# Patient Record
Sex: Female | Born: 1990 | Race: White | Hispanic: No | Marital: Married | Smoking: Never smoker
Health system: Southern US, Community
[De-identification: ages and names within clinical notes are randomized; demographics above are authoritative.]

## PROBLEM LIST (undated history)

## (undated) DIAGNOSIS — N926 Irregular menstruation, unspecified: Secondary | ICD-10-CM

## (undated) DIAGNOSIS — N920 Excessive and frequent menstruation with regular cycle: Secondary | ICD-10-CM

## (undated) DIAGNOSIS — F419 Anxiety disorder, unspecified: Secondary | ICD-10-CM

## (undated) DIAGNOSIS — F32A Depression, unspecified: Secondary | ICD-10-CM

## (undated) DIAGNOSIS — R87619 Unspecified abnormal cytological findings in specimens from cervix uteri: Secondary | ICD-10-CM

## (undated) HISTORY — PX: OTHER SURGICAL HISTORY: SHX169

## (undated) HISTORY — DX: Depression, unspecified: F32.A

## (undated) HISTORY — DX: Excessive and frequent menstruation with regular cycle: N92.0

## (undated) HISTORY — DX: Unspecified abnormal cytological findings in specimens from cervix uteri: R87.619

## (undated) HISTORY — DX: Anxiety disorder, unspecified: F41.9

## (undated) HISTORY — DX: Irregular menstruation, unspecified: N92.6

---

## 2014-05-18 DIAGNOSIS — L501 Idiopathic urticaria: Secondary | ICD-10-CM | POA: Insufficient documentation

## 2014-12-10 ENCOUNTER — Encounter: Payer: Self-pay | Admitting: Obstetrics and Gynecology

## 2015-04-18 NOTE — L&D Delivery Note (Signed)
Delivery Note At  1404 a viable and healthy female "Natasha Lowery" was delivered via  (Presentation:LOA ;  ).  APGAR:8 ,9 ; weight  .   Placenta status: delivered intact with 3 vessel Cord:  with the following complications: none  Anesthesia:  epidural Episiotomy:  none Lacerations:  none Suture Repair: NA Est. Blood Loss (mL):  250  Mom to postpartum.  Baby to Couplet care / Skin to Skin.  Melody Suzan NailerN Shambley, CNM 12/15/2015, 2:18 PM  I have reviewed the record and concur with patient management and plan. Shaleena Crusoe, Daphine DeutscherMARTIN, MD, Evern CoreFACOG

## 2015-04-20 ENCOUNTER — Encounter: Payer: Self-pay | Admitting: Certified Nurse Midwife

## 2015-04-29 ENCOUNTER — Encounter: Payer: Self-pay | Admitting: Obstetrics and Gynecology

## 2015-04-29 ENCOUNTER — Ambulatory Visit (INDEPENDENT_AMBULATORY_CARE_PROVIDER_SITE_OTHER): Payer: Medicaid Other | Admitting: Obstetrics and Gynecology

## 2015-04-29 VITALS — BP 119/82 | HR 91 | Ht 66.0 in | Wt 178.8 lb

## 2015-04-29 DIAGNOSIS — O219 Vomiting of pregnancy, unspecified: Secondary | ICD-10-CM | POA: Diagnosis not present

## 2015-04-29 DIAGNOSIS — Z36 Encounter for antenatal screening of mother: Secondary | ICD-10-CM

## 2015-04-29 DIAGNOSIS — N912 Amenorrhea, unspecified: Secondary | ICD-10-CM | POA: Diagnosis not present

## 2015-04-29 DIAGNOSIS — Z3687 Encounter for antenatal screening for uncertain dates: Secondary | ICD-10-CM

## 2015-04-29 LAB — POCT URINE PREGNANCY: PREG TEST UR: POSITIVE — AB

## 2015-04-29 MED ORDER — DOXYLAMINE-PYRIDOXINE 10-10 MG PO TBEC: 10.0000 mg | DELAYED_RELEASE_TABLET | Freq: Every day | ORAL | Status: DC

## 2015-04-29 NOTE — Patient Instructions (Signed)
1.  Ultrasound for dating of her pregnancy Will be scheduled. 2.  New OB nursing intake visit will be in 2 weeks. 3.  Diclegis prescription is given for nausea. 4.  Prenatal vitamin prescription is sent in.

## 2015-04-29 NOTE — Progress Notes (Signed)
Chief complaint: 1.  Amenorrhea.  2.  Positive home pregnancy test.  The patient is a 25 year old  White female, gravida 2, para 1, 001, unsure last menstrual period,?  02/20/2015, EDD 11/27/2015, EGA 9.5 weeks, presents for pregnancy confirmation. Patient was taking multivitamins prior to conception. Patient is experiencing nausea without vomiting. Patient does report  Mild breast tenderness. Patient is not experiencing any vaginal spotting or discharge.  Past GYN history: Menarche-age 62. History of irregular cycles with interval ranging from 2 weeks to 6 weeks. Duration of flow of menses is typically 7 days. Long history of severe dysmenorrhea with central cramping and perimenstrual lock pain without significant radiation; typically requires four  Advil 3 times a day for relief; she has missed school because of severe dysmenorrhea; previous use of Mirena IUD helped significantly with cramps; no history of endometriosis, personally or in family. No history of deep thrusting dyspareunia. No history of anormal Pap smears. No history of STDs  Past OB history: Para 1, 001. SVD 1, 6 lbs. 3 oz. Female, OP delivery  Past medical history: Dysmenorrhea. Seasonal allergie.  Past surgical history: None  Family history: Colon cancer-maternal great-grandmothernd paternal great grandfather. Ovary cancer-none. Breast cancer-none.. Coronary artery disease-grandparents. Diabetes mellitus-grandparents.  Social history: Tobacco.-Negative. Alcohol-social. Drugs-negative. Patient works at [employer].  [employer].  ASSESSMENT: 1.  First trimester pregnancy, unsure last menstrual period. 2.  Nausea without vomiting in pregnancy.  PLAN: 1.  Pelvic ultrasound. 2.  Diclegis presption for nausea. 3.   Prenatal vitamin prescription. 4.  Return in 2 Weeks for New OB nursing intake and prenatal labs 5.  New OB counseling:  The patient has been given an overview regarding routine  prenatal care.  Recommendations regarding diet, weight gain, and exercise in pregnancy were given.  Prenatal testing, optional genetic testing, and ultrasound use in pregnancy were reviewed.   Benefits of Breast Feeding were discussed. The patient is encouraged to consider nursing her baby post partum. She is made aware of the NEGATIVE impact of THC (marijuana) on fetal development and postpartum development of the infant, and is strongly advised to avoid Marijuana exposure.  A total of 30 minutes were spent face-to-face with the patient during the encounter with greater than 50% dealing with counseling and coordination of care.  Herold HarmsMartin A Arial Galligan, MD

## 2015-05-04 ENCOUNTER — Ambulatory Visit (INDEPENDENT_AMBULATORY_CARE_PROVIDER_SITE_OTHER): Payer: Medicaid Other

## 2015-05-04 DIAGNOSIS — Z36 Encounter for antenatal screening of mother: Secondary | ICD-10-CM | POA: Diagnosis not present

## 2015-05-04 DIAGNOSIS — Z3687 Encounter for antenatal screening for uncertain dates: Secondary | ICD-10-CM

## 2015-05-10 ENCOUNTER — Encounter: Payer: Self-pay | Admitting: Certified Nurse Midwife

## 2015-05-13 ENCOUNTER — Ambulatory Visit (INDEPENDENT_AMBULATORY_CARE_PROVIDER_SITE_OTHER): Payer: Medicaid Other | Admitting: Obstetrics and Gynecology

## 2015-05-13 VITALS — BP 104/68 | HR 63 | Wt 180.3 lb

## 2015-05-13 DIAGNOSIS — Z1389 Encounter for screening for other disorder: Secondary | ICD-10-CM

## 2015-05-13 DIAGNOSIS — Z331 Pregnant state, incidental: Secondary | ICD-10-CM

## 2015-05-13 DIAGNOSIS — R638 Other symptoms and signs concerning food and fluid intake: Secondary | ICD-10-CM

## 2015-05-13 DIAGNOSIS — Z3490 Encounter for supervision of normal pregnancy, unspecified, unspecified trimester: Secondary | ICD-10-CM

## 2015-05-13 DIAGNOSIS — Z369 Encounter for antenatal screening, unspecified: Secondary | ICD-10-CM

## 2015-05-13 DIAGNOSIS — Z113 Encounter for screening for infections with a predominantly sexual mode of transmission: Secondary | ICD-10-CM

## 2015-05-13 DIAGNOSIS — Z36 Encounter for antenatal screening of mother: Secondary | ICD-10-CM

## 2015-05-13 MED ORDER — CONCEPT DHA 53.5-38-1 MG PO CAPS: 1.0000 | ORAL_CAPSULE | Freq: Every day | ORAL | Status: DC

## 2015-05-13 NOTE — Patient Instructions (Signed)
Hyperemesis Gravidarum Hyperemesis gravidarum is a severe form of nausea and vomiting that happens during pregnancy. Hyperemesis is worse than morning sickness. It may cause you to have nausea or vomiting all day for many days. It may keep you from eating and drinking enough food and liquids. Hyperemesis usually occurs during the first half (the first 20 weeks) of pregnancy. It often goes away once a woman is in her second half of pregnancy. However, sometimes hyperemesis continues through an entire pregnancy.  CAUSES  The cause of this condition is not completely known but is thought to be related to changes in the body's hormones when pregnant. It could be from the high level of the pregnancy hormone or an increase in estrogen in the body.  SIGNS AND SYMPTOMS   Severe nausea and vomiting.  Nausea that does not go away.  Vomiting that does not allow you to keep any food down.  Weight loss and body fluid loss (dehydration).  Having no desire to eat or not liking food you have previously enjoyed. DIAGNOSIS  Your health care provider will do a physical exam and ask you about your symptoms. He or she may also order blood tests and urine tests to make sure something else is not causing the problem.  TREATMENT  You may only need medicine to control the problem. If medicines do not control the nausea and vomiting, you will be treated in the hospital to prevent dehydration, increased acid in the blood (acidosis), weight loss, and changes in the electrolytes in your body that may harm the unborn baby (fetus). You may need IV fluids.  HOME CARE INSTRUCTIONS   Only take over-the-counter or prescription medicines as directed by your health care provider.  Try eating a couple of dry crackers or toast in the morning before getting out of bed.  Avoid foods and smells that upset your stomach.  Avoid fatty and spicy foods.  Eat 5-6 small meals a day.  Do not drink when eating meals. Drink between  meals.  For snacks, eat high-protein foods, such as cheese.  Eat or suck on things that have ginger in them. Ginger helps nausea.  Avoid food preparation. The smell of food can spoil your appetite.  Avoid iron pills and iron in your multivitamins until after 3-4 months of being pregnant. However, consult with your health care provider before stopping any prescribed iron pills. SEEK MEDICAL CARE IF:   Your abdominal pain increases.  You have a severe headache.  You have vision problems.  You are losing weight. SEEK IMMEDIATE MEDICAL CARE IF:   You are unable to keep fluids down.  You vomit blood.  You have constant nausea and vomiting.  You have excessive weakness.  You have extreme thirst.  You have dizziness or fainting.  You have a fever or persistent symptoms for more than 2-3 days.  You have a fever and your symptoms suddenly get worse. MAKE SURE YOU:   Understand these instructions.  Will watch your condition.  Will get help right away if you are not doing well or get worse.   This information is not intended to replace advice given to you by your health care provider. Make sure you discuss any questions you have with your health care provider.   Document Released: 04/03/2005 Document Revised: 01/22/2013 Document Reviewed: 11/13/2012 Elsevier Interactive Patient Education 2016 [employer]. Commonly Asked Questions During Pregnancy  Cats: A parasite can be excreted in cat feces.  To avoid exposure you need to  have another person empty the little box.  If you must empty the litter box you will need to wear gloves.  Wash your hands after handling your cat.  This parasite can also be found in raw or undercooked meat so this should also be avoided.  Colds, Sore Throats, Flu: Please check your medication sheet to see what you can take for symptoms.  If your symptoms are unrelieved by these medications please call the office.  Dental Work: Most any dental work  Agricultural consultant recommends is permitted.  X-rays should only be taken during the first trimester if absolutely necessary.  Your abdomen should be shielded with a lead apron during all x-rays.  Please notify your provider prior to receiving any x-rays.  Novocaine is fine; gas is not recommended.  If your dentist requires a note from [place] prior to dental work please call the office and we will provide one for you.  Exercise: Exercise is an important part of staying healthy during your pregnancy.  You may continue most exercises you were accustomed to prior to pregnancy.  Later in your pregnancy you will most likely notice you have difficulty with activities requiring balance like riding a bicycle.  It is important that you listen to your body and avoid activities that put you at a higher risk of falling.  Adequate rest and staying well hydrated are a must!  If you have questions about the safety of specific activities ask your provider.    Exposure to Children with illness: Try to avoid obvious exposure; report any symptoms to [place] when noted,  If you have chicken pos, red measles or mumps, you should be immune to these diseases.   Please do not take any vaccines while pregnant unless you have checked with your OB provider.  Fetal Movement: After 28 weeks we recommend you do "kick counts" twice daily.  Lie or sit down in a calm quiet environment and count your baby movements "kicks".  You should feel your baby at least 10 times per hour.  If you have not felt 10 kicks within the first hour get up, walk around and have something sweet to eat or drink then repeat for an additional hour.  If count remains less than 10 per hour notify your provider.  Fumigating: Follow your pest control agent's advice as to how long to stay out of your home.  Ventilate the area well before re-entering.  Hemorrhoids:   Most over-the-counter preparations can be used during pregnancy.  Check your medication to see what is safe to use.  It  is important to use a stool softener or fiber in your diet and to drink lots of liquids.  If hemorrhoids seem to be getting worse please call the office.   Hot Tubs:  Hot tubs Jacuzzis and saunas are not recommended while pregnant.  These increase your internal body temperature and should be avoided.  Intercourse:  Sexual intercourse is safe during pregnancy as long as you are comfortable, unless otherwise advised by your provider.  Spotting may occur after intercourse; report any bright red bleeding that is heavier than spotting.  Labor:  If you know that you are in labor, please go to the hospital.  If you are unsure, please call the office and let us help you decide what to do.  Lifting, straining, etc:  If your job requires heavy lifting or straining please check with your provider for any limitations.  Generally, you should not lift items heavier than that  you can lift simply with your hands and arms (no back muscles)  Painting:  Paint fumes do not harm your pregnancy, but may make you ill and should be avoided if possible.  Latex or water based paints have less odor than oils.  Use adequate ventilation while painting.  Permanents & Hair Color:  Chemicals in hair dyes are not recommended as they cause increase hair dryness which can increase hair loss during pregnancy.  " Highlighting" and permanents are allowed.  Dye may be absorbed differently and permanents may not hold as well during pregnancy.  Sunbathing:  Use a sunscreen, as skin burns easily during pregnancy.  Drink plenty of fluids; avoid over heating.  Tanning Beds:  Because their possible side effects are still unknown, tanning beds are not recommended.  Ultrasound Scans:  Routine ultrasounds are performed at approximately 20 weeks.  You will be able to see your baby's general anatomy an if you would like to know the gender this can usually be determined as well.  If it is questionable when you conceived you may also receive an  ultrasound early in your pregnancy for dating purposes.  Otherwise ultrasound exams are not routinely performed unless there is a medical necessity.  Although you can request a scan we ask that you pay for it when conducted because insurance does not cover " patient request" scans.  Work: If your pregnancy proceeds without complications you may work until your due date, unless your physician or employer advises otherwise.  Round Ligament Pain/Pelvic Discomfort:  Sharp, shooting pains not associated with bleeding are fairly common, usually occurring in the second trimester of pregnancy.  They tend to be worse when standing up or when you remain standing for long periods of time.  These are the result of pressure of certain pelvic ligaments called "round ligaments".  Rest, Tylenol and heat seem to be the most effective relief.  As the womb and fetus grow, they rise out of the pelvis and the discomfort improves.  Please notify the office if your pain seems different than that described.  It may represent a more serious condition.  Minor Illnesses and Medications in Pregnancy  Cold/Flu:  Sudafed for congestion- Robitussin (plain) for cough- Tylenol for discomfort.  Please follow the directions on the label.  Try not to take any more than needed.  OTC Saline nasal spray and air humidifier or cool-mist  Vaporizer to sooth nasal irritation and to loosen congestion.  It is also important to increase intake of non carbonated fluids, especially if you have a fever.  Constipation:  Colace-2 capsules at bedtime; Metamucil- follow directions on label; Senokot- 1 tablet at bedtime.  Any one of these medications can be used.  It is also very important to increase fluids and fruits along with regular exercise.  If problem persists please call the office.  Diarrhea:  Kaopectate as directed on the label.  Eat a bland diet and increase fluids.  Avoid highly seasoned foods.  Headache:  Tylenol 1 or 2 tablets every 3-4  hours as needed  Indigestion:  Maalox, Mylanta, Tums or Rolaids- as directed on label.  Also try to eat small meals and avoid fatty, greasy or spicy foods.  Nausea with or without Vomiting:  Nausea in pregnancy is caused by increased levels of hormones in the body which influence the digestive system and cause irritation when stomach acids accumulate.  Symptoms usually subside after 1st trimester of pregnancy.  Try the following:  Keep saltines, graham crackers  or dry toast by your bed to eat upon awakening.  Don't let your stomach get empty.  Try to eat 5-6 small meals per day instead of 3 large ones.  Avoid greasy fatty or highly seasoned foods.   Take OTC Unisom 1 tablet at bed time along with OTC Vitamin B6 25-50 mg 3 times per day.    If nausea continues with vomiting and you are unable to keep down food and fluids you may need a prescription medication.  Please notify your provider.   Sore throat:  Chloraseptic spray, throat lozenges and or plain Tylenol.  Vaginal Yeast Infection:  OTC Monistat for 7 days as directed on label.  If symptoms do not resolve within a week notify provider.  If any of the above problems do not subside with recommended treatment please call the office for further assistance.   Do not take Aspirin, Advil, Motrin or Ibuprofen.  * * OTC= Over the counter Pregnancy and Zika Virus Disease Zika virus disease, or Zika, is an illness that can spread to people from mosquitoes that carry the virus. It may also spread from person to person through infected body fluids. Zika first occurred in [place], but recently it has spread to new areas. The virus occurs in tropical climates. The location of Zika continues to change. Most people who become infected with Zika virus do not develop serious illness. However, Zika may cause birth defects in an unborn baby whose mother is infected with the virus. It may also increase the risk of miscarriage. WHAT ARE THE SYMPTOMS OF ZIKA  VIRUS DISEASE? In many cases, people who have been infected with Zika virus do not develop any symptoms. If symptoms appear, they usually start about a week after the person is infected. Symptoms are usually mild. They may include:  Fever.  Rash.  Red eyes.  Joint pain. HOW DOES ZIKA VIRUS DISEASE SPREAD? The main way that Zika virus spreads is through the bite of a certain type of mosquito. Unlike most types of mosquitos, which bite only at night, the type of mosquito that carries Zika virus bites both at night and during the day. Zika virus can also spread through sexual contact, through a blood transfusion, and from a mother to her baby before or during birth. Once you have had Zika virus disease, it is unlikely that you will get it again. CAN I PASS ZIKA TO MY BABY DURING PREGNANCY? Yes, Zika can pass from a mother to her baby before or during birth. WHAT PROBLEMS CAN ZIKA CAUSE FOR MY BABY? A woman who is infected with Zika virus while pregnant is at risk of having her baby born with a condition in which the brain or head is smaller than expected (microcephaly). Babies who have microcephaly can have developmental delays, seizures, hearing problems, and vision problems. Having Zika virus disease during pregnancy can also increase the risk of miscarriage. HOW CAN ZIKA VIRUS DISEASE BE PREVENTED? There is no vaccine to prevent Zika. The best way to prevent the disease is to avoid infected mosquitoes and avoid exposure to body fluids that can spread the virus. Avoid any possible exposure to Zika by taking the following precautions. For women and their sex partners:  Avoid traveling to high-risk areas. The locations where [place] is being reported change often. To identify high-risk areas, check the CDC travel website: http://davidson-gomez.com/  If you or your sex partner must travel to a high-risk area, talk with a health care provider before and after traveling.  Take all precautions  to avoid mosquito bites if you live in, or travel to, any of the high-risk areas. Insect repellents are safe to use during pregnancy.  Ask your health care provider when it is safe to have sexual contact. For women:  If you are pregnant or trying to become pregnant, avoid sexual contact with persons who may have been exposed to [place] virus, persons who have possible symptoms of Zika, or persons whose history you are unsure about. If you choose to have sexual contact with someone who may have been exposed to [place] virus, use condoms correctly during the entire duration of sexual activity, every time. Do not share sexual devices, as you may be exposed to body fluids.  Ask your health care provider about when it is safe to attempt pregnancy after a possible exposure to Zika virus. WHAT STEPS SHOULD I TAKE TO AVOID MOSQUITO BITES? Take these steps to avoid mosquito bites when you are in a high-risk area:  Wear loose clothing that covers your arms and legs.  Limit your outdoor activities.  Do not open windows unless they have window screens.  Sleep under mosquito nets.  Use insect repellent. The best insect repellents have:  DEET, picaridin, oil of lemon eucalyptus (OLE), or IR3535 in them.  Higher amounts of an active ingredient in them.  Remember that insect repellents are safe to use during pregnancy.  Do not use OLE on children who are younger than 66 years of age. Do not use insect repellent on babies who are younger than 59 months of age.  Cover your child's stroller with mosquito netting. Make sure the netting fits snugly and that any loose netting does not cover your child's mouth or nose. Do not use a blanket as a mosquito-protection cover.  Do not apply insect repellent underneath clothing.  If you are using sunscreen, apply the sunscreen before applying the insect repellent.  Treat clothing with permethrin. Do not apply permethrin directly to your skin. Follow label directions  for safe use.  Get rid of standing water, where mosquitoes may reproduce. Standing water is often found in items such as buckets, bowls, animal food dishes, and flowerpots. When you return from traveling to any high-risk area, continue taking actions to protect yourself against mosquito bites for 3 weeks, even if you show no signs of illness. This will prevent spreading Zika virus to uninfected mosquitoes. WHAT SHOULD I KNOW ABOUT THE SEXUAL TRANSMISSION OF ZIKA? People can spread Zika to their sexual partners during vaginal, anal, or oral sex, or by sharing sexual devices. Many people with [place] do not develop symptoms, so a person could spread the disease without knowing that they are infected. The greatest risk is to women who are pregnant or who may become pregnant. Zika virus can live longer in semen than it can live in blood. Couples can prevent sexual transmission of the virus by:  Using condoms correctly during the entire duration of sexual activity, every time. This includes vaginal, anal, and oral sex.  Not sharing sexual devices. Sharing increases your risk of being exposed to body fluid from another person.  Avoiding all sexual activity until your health care provider says it is safe. SHOULD I BE TESTED FOR ZIKA VIRUS? A sample of your blood can be tested for Zika virus. A pregnant woman should be tested if she may have been exposed to the virus or if she has symptoms of Zika. She may also have additional tests done during her pregnancy, such  ultrasound testing. Talk with your health care provider about which tests are recommended.   This information is not intended to replace advice given to you by your health care provider. Make sure you discuss any questions you have with your health care provider.   Document Released: 12/23/2014 Document Reviewed: 12/16/2014 Elsevier Interactive Patient Education [employer]

## 2015-05-13 NOTE — Progress Notes (Signed)
   Natasha Lowery presents for NOB nurse interview visit. G-2.  P-1001. Pregnancy education material explained and given. No cats in the home. NOB labs ordered. TSH/HbgA1c due to Increased BMI, HIV labs and Drug screen were explained optional and she could opt out of tests but did not decline. Drug screen ordered. PNV encouraged. NT ordered, will try to schedule with NOB physical if possible. Pt states her mother has Lupus but is in remission and MGM is deceased due to Lupus.  Also wants msAFP in 2nd trimester. Pt. To follow up with provider in 3 weeks for NOB physical.  All questions answered.  ZIKA EXPOSURE SCREEN:  The patient has not traveled to a [place] Virus endemic area within the past 6 months, nor has she had unprotected sex with a partner who has travelled to a [place] endemic region within the past 6 months. The patient has been advised to notify us if these factors change any time during this current pregnancy, so adequate testing and monitoring can be initiated.

## 2015-05-14 ENCOUNTER — Other Ambulatory Visit: Payer: Self-pay | Admitting: Obstetrics and Gynecology

## 2015-05-14 DIAGNOSIS — Z2839 Other underimmunization status: Secondary | ICD-10-CM

## 2015-05-14 DIAGNOSIS — Z6791 Unspecified blood type, Rh negative: Secondary | ICD-10-CM | POA: Insufficient documentation

## 2015-05-14 DIAGNOSIS — O26899 Other specified pregnancy related conditions, unspecified trimester: Secondary | ICD-10-CM

## 2015-05-14 DIAGNOSIS — O9989 Other specified diseases and conditions complicating pregnancy, childbirth and the puerperium: Secondary | ICD-10-CM

## 2015-05-14 DIAGNOSIS — O360110 Maternal care for anti-D [Rh] antibodies, first trimester, not applicable or unspecified: Secondary | ICD-10-CM

## 2015-05-14 DIAGNOSIS — Z283 Underimmunization status: Secondary | ICD-10-CM | POA: Insufficient documentation

## 2015-05-14 DIAGNOSIS — O09899 Supervision of other high risk pregnancies, unspecified trimester: Secondary | ICD-10-CM

## 2015-05-14 LAB — RPR: RPR: NONREACTIVE

## 2015-05-14 LAB — CBC WITH DIFFERENTIAL/PLATELET
BASOS ABS: 0 10*3/uL (ref 0.0–0.2)
Basos: 0 %
EOS (ABSOLUTE): 0.1 10*3/uL (ref 0.0–0.4)
Eos: 1 %
HEMOGLOBIN: 11.8 g/dL (ref 11.1–15.9)
Hematocrit: 34.5 % (ref 34.0–46.6)
IMMATURE GRANS (ABS): 0 10*3/uL (ref 0.0–0.1)
IMMATURE GRANULOCYTES: 0 %
LYMPHS: 29 %
Lymphocytes Absolute: 2 10*3/uL (ref 0.7–3.1)
MCH: 29.6 pg (ref 26.6–33.0)
MCHC: 34.2 g/dL (ref 31.5–35.7)
MCV: 87 fL (ref 79–97)
MONOCYTES: 10 %
Monocytes Absolute: 0.7 10*3/uL (ref 0.1–0.9)
NEUTROS ABS: 4.1 10*3/uL (ref 1.4–7.0)
Neutrophils: 60 %
Platelets: 235 10*3/uL (ref 150–379)
RBC: 3.99 x10E6/uL (ref 3.77–5.28)
RDW: 12.9 % (ref 12.3–15.4)
WBC: 6.9 10*3/uL (ref 3.4–10.8)

## 2015-05-14 LAB — RH TYPE: Rh Factor: NEGATIVE

## 2015-05-14 LAB — URINALYSIS, ROUTINE W REFLEX MICROSCOPIC
Bilirubin, UA: NEGATIVE
Glucose, UA: NEGATIVE
KETONES UA: NEGATIVE
Nitrite, UA: NEGATIVE
PH UA: 6.5 (ref 5.0–7.5)
Protein, UA: NEGATIVE
RBC, UA: NEGATIVE
SPEC GRAV UA: 1.01 (ref 1.005–1.030)
Urobilinogen, Ur: 0.2 mg/dL (ref 0.2–1.0)

## 2015-05-14 LAB — MICROSCOPIC EXAMINATION: Casts: NONE SEEN /lpf

## 2015-05-14 LAB — RUBELLA ANTIBODY, IGM: Rubella IgM: <20 AU/mL (ref 0.0–19.9)

## 2015-05-14 LAB — TSH: TSH: 1.59 u[IU]/mL (ref 0.450–4.500)

## 2015-05-14 LAB — NICOTINE SCREEN, URINE: COTININE UR QL SCN: NEGATIVE ng/mL

## 2015-05-14 LAB — HEMOGLOBIN A1C
Est. average glucose Bld gHb Est-mCnc: 108 mg/dL
HEMOGLOBIN A1C: 5.4 % (ref 4.8–5.6)

## 2015-05-14 LAB — ANTIBODY SCREEN: ANTIBODY SCREEN: NEGATIVE

## 2015-05-14 LAB — VARICELLA ZOSTER ANTIBODY, IGM: Varicella IgM: <0.91 index (ref 0.00–0.90)

## 2015-05-14 LAB — ABO

## 2015-05-14 LAB — HIV ANTIBODY (ROUTINE TESTING W REFLEX): HIV SCREEN 4TH GENERATION: NONREACTIVE

## 2015-05-14 LAB — HEPATITIS B SURFACE ANTIGEN: Hepatitis B Surface Ag: NEGATIVE

## 2015-05-15 LAB — GC/CHLAMYDIA PROBE AMP
Chlamydia trachomatis, NAA: NEGATIVE
Neisseria gonorrhoeae by PCR: NEGATIVE

## 2015-05-15 LAB — CULTURE, OB URINE

## 2015-05-15 LAB — URINE CULTURE, OB REFLEX

## 2015-05-19 LAB — PAIN MGT SCRN (14 DRUGS), UR
AMPHETAMINE SCRN UR: NEGATIVE ng/mL
BENZODIAZEPINE SCREEN, URINE: NEGATIVE ng/mL
Barbiturate Screen, Ur: NEGATIVE ng/mL
Buprenorphine, Urine: NEGATIVE ng/mL
CANNABINOIDS UR QL SCN: NEGATIVE ng/mL
COCAINE(METAB.) SCREEN, URINE: NEGATIVE ng/mL
Creatinine(Crt), U: 51.7 mg/dL (ref 20.0–300.0)
FENTANYL, URINE: NEGATIVE pg/mL
Meperidine Screen, Urine: NEGATIVE ng/mL
Methadone Scn, Ur: NEGATIVE ng/mL
OXYCODONE+OXYMORPHONE UR QL SCN: NEGATIVE ng/mL
Opiate Scrn, Ur: NEGATIVE ng/mL
PCP SCRN UR: NEGATIVE ng/mL
PH UR, DRUG SCRN: 6.4 (ref 4.5–8.9)
PROPOXYPHENE SCREEN: NEGATIVE ng/mL
TRAMADOL UR QL SCN: NEGATIVE ng/mL

## 2015-06-10 ENCOUNTER — Ambulatory Visit (INDEPENDENT_AMBULATORY_CARE_PROVIDER_SITE_OTHER): Payer: Medicaid Other

## 2015-06-10 ENCOUNTER — Encounter: Payer: Self-pay | Admitting: Obstetrics and Gynecology

## 2015-06-10 ENCOUNTER — Other Ambulatory Visit: Payer: Self-pay | Admitting: Obstetrics and Gynecology

## 2015-06-10 ENCOUNTER — Ambulatory Visit (INDEPENDENT_AMBULATORY_CARE_PROVIDER_SITE_OTHER): Payer: Medicaid Other | Admitting: Obstetrics and Gynecology

## 2015-06-10 VITALS — BP 109/64 | HR 72 | Wt 178.9 lb

## 2015-06-10 DIAGNOSIS — O09899 Supervision of other high risk pregnancies, unspecified trimester: Secondary | ICD-10-CM

## 2015-06-10 DIAGNOSIS — Z283 Underimmunization status: Secondary | ICD-10-CM

## 2015-06-10 DIAGNOSIS — Z331 Pregnant state, incidental: Secondary | ICD-10-CM

## 2015-06-10 DIAGNOSIS — O9989 Other specified diseases and conditions complicating pregnancy, childbirth and the puerperium: Secondary | ICD-10-CM

## 2015-06-10 DIAGNOSIS — Z3490 Encounter for supervision of normal pregnancy, unspecified, unspecified trimester: Secondary | ICD-10-CM

## 2015-06-10 DIAGNOSIS — Z36 Encounter for antenatal screening of mother: Secondary | ICD-10-CM | POA: Diagnosis not present

## 2015-06-10 DIAGNOSIS — Z369 Encounter for antenatal screening, unspecified: Secondary | ICD-10-CM

## 2015-06-10 DIAGNOSIS — Z2839 Other underimmunization status: Secondary | ICD-10-CM

## 2015-06-10 DIAGNOSIS — Z789 Other specified health status: Secondary | ICD-10-CM

## 2015-06-10 LAB — POCT URINALYSIS DIPSTICK
Bilirubin, UA: NEGATIVE
GLUCOSE UA: NEGATIVE
Ketones, UA: NEGATIVE
Leukocytes, UA: NEGATIVE
NITRITE UA: NEGATIVE
PROTEIN UA: NEGATIVE
RBC UA: NEGATIVE
UROBILINOGEN UA: 0.2
pH, UA: 6

## 2015-06-10 NOTE — Progress Notes (Signed)
NEW OB HISTORY AND PHYSICAL  SUBJECTIVE:       Natasha Lowery is a 25 y.o. G62P1001 female, Patient's last menstrual period was 02/20/2015 (approximate)., Estimated Date of Delivery: 12/18/15, [redacted]w[redacted]d, presents today for establishment of Prenatal Care. She has no unusual complaints and complains of decreased appetite,      Gynecologic History Patient's last menstrual period was 02/20/2015 (approximate). Normal Contraception: none Last Pap: 2014. Results were: normal  Obstetric History OB History  Gravida Para Term Preterm AB SAB TAB Ectopic Multiple Living  # Outcome Date GA Lbr Len/2nd Weight Sex Delivery Anes PTL Lv  2 Current           1 Term 2014   6 lb 4.8 oz (2.858 kg) M Vag-Spont   Y      Past Medical History  Diagnosis Date  . Heavy periods   . Irregular periods     Past Surgical History  Procedure Laterality Date  . None      Current Outpatient Prescriptions on File Prior to Visit  Medication Sig Dispense Refill  . Doxylamine-Pyridoxine 10-10 MG TBEC Take 10 mg by mouth daily. 60 tablet 1  . Prenat-FeFum-FePo-FA-Omega 3 (CONCEPT DHA) 53.5-38-1 MG CAPS Take 1 tablet by mouth daily. 30 capsule 11   No current facility-administered medications on file prior to visit.    No Known Allergies  Social History   Social History  . Marital Status: Married    Spouse Name: N/A  . Number of Children: N/A  . Years of Education: N/A   Occupational History  . Not on file.   Social History Main Topics  . Smoking status: Never Smoker   . Smokeless tobacco: Not on file  . Alcohol Use: Yes     Comment: rare  . Drug Use: No  . Sexual Activity: Yes    Birth Control/ Protection: None   Other Topics Concern  . Not on file   Social History Narrative    Family History  Problem Relation Age of Onset  . Colon cancer Maternal Grandmother     grt mat gm  . Diabetes Maternal Grandfather   . Diabetes Paternal Grandmother   . Heart disease Paternal  Grandmother   . Breast cancer Neg Hx   . Ovarian cancer Neg Hx   . Lupus Mother     in remission  . Lupus Maternal Grandmother     deceased due to Lupus    The following portions of the patient's history were reviewed and updated as appropriate: allergies, current medications, past OB history, past medical history, past surgical history, past family history, past social history, and problem list.    OBJECTIVE: Initial Physical Exam (New OB)  GENERAL APPEARANCE: alert, well appearing, in no apparent distress, oriented to person, place and time, overweight HEAD: normocephalic, atraumatic MOUTH: mucous membranes moist, pharynx normal without lesions and dental hygiene good THYROID: no thyromegaly or masses present BREASTS: not examined LUNGS: clear to auscultation, no wheezes, rales or rhonchi, symmetric air entry HEART: regular rate and rhythm, no murmurs ABDOMEN: soft, nontender, nondistended, no abnormal masses, no epigastric pain, fundus not palpable and FHT present EXTREMITIES: no redness or tenderness in the calves or thighs SKIN: normal coloration and turgor, no rashes LYMPH NODES: no adenopathy palpable NEUROLOGIC: alert, oriented, normal speech, no focal findings or movement disorder noted  Indications:First Trimester Screen - NT Findings:  Singleton intrauterine pregnancy is visualized with  a CRL consistent with 13 1/[redacted] weeks gestation, giving an (U/S) EDD of 12/15/15. The (U/S) EDD is consistent with the clinically established (LMP) EDD of 12/18/15.  FHR: 147 CRL measurement: 68.6 mm NT measurement: 1.7 mm. Questionable SUA.  Reevaluate at [facility].  Right Ovary measures 3.6 x 2.5 x 2.5  cm. It is normal in appearance. Left Ovary measures 3.9 x 2.5 x 2.6 cm. It is normal appearance. There is evidence of a corpus luteal cyst in the Left Survey of the adnexa demonstrates no adnexal masses. There is no free peritoneal fluid in the cul de sac.  Impression: 1. 13 1/7  week Viable Singleton Intrauterine pregnancy by U/S. 2. (U/S) EDD is consistent with Clinically established (LMP) EDD of 12/18/15. 3. NT Screen successfully completed.  Recommendations: 1.Clinical correlation with the patient's History and Physical Exam. 2. Evaluate for SUA at anatomy scan PELVIC EXAM EXTERNAL GENITALIA: normal appearing vulva with no masses, tenderness or lesions CERVIX: no lesions or cervical motion tenderness UTERUS: gravid  ASSESSMENT: Normal pregnancy Overweight   PLAN: Prenatal care Pap obtained See orders

## 2015-06-10 NOTE — Progress Notes (Signed)
ROB- pt is having some heartburn

## 2015-06-10 NOTE — Patient Instructions (Addendum)
Second Trimester of Pregnancy The second trimester is from week 13 through week 28, months 4 through 6. The second trimester is often a time when you feel your best. Your body has also adjusted to being pregnant, and you begin to feel better physically. Usually, morning sickness has lessened or quit completely, you may have more energy, and you may have an increase in appetite. The second trimester is also a time when the fetus is growing rapidly. At the end of the sixth month, the fetus is about 9 inches long and weighs about 1 pounds. You will likely begin to feel the baby move (quickening) between 18 and 20 weeks of the pregnancy. BODY CHANGES Your body goes through many changes during pregnancy. The changes vary from woman to woman.   Your weight will continue to increase. You will notice your lower abdomen bulging out.  You may begin to get stretch marks on your hips, abdomen, and breasts.  You may develop headaches that can be relieved by medicines approved by your health care provider.  You may urinate more often because the fetus is pressing on your bladder.  You may develop or continue to have heartburn as a result of your pregnancy.  You may develop constipation because certain hormones are causing the muscles that push waste through your intestines to slow down.  You may develop hemorrhoids or swollen, bulging veins (varicose veins).  You may have back pain because of the weight gain and pregnancy hormones relaxing your joints between the bones in your pelvis and as a result of a shift in weight and the muscles that support your balance.  Your breasts will continue to grow and be tender.  Your gums may bleed and may be sensitive to brushing and flossing.  Dark spots or blotches (chloasma, mask of pregnancy) may develop on your face. This will likely fade after the baby is born.  A dark line from your belly button to the pubic area (linea nigra) may appear. This will likely fade  after the baby is born.  You may have changes in your hair. These can include thickening of your hair, rapid growth, and changes in texture. Some women also have hair loss during or after pregnancy, or hair that feels dry or thin. Your hair will most likely return to normal after your baby is born. WHAT TO EXPECT AT YOUR PRENATAL VISITS During a routine prenatal visit:  You will be weighed to make sure you and the fetus are growing normally.  Your blood pressure will be taken.  Your abdomen will be measured to track your baby's growth.  The fetal heartbeat will be listened to.  Any test results from the previous visit will be discussed. Your health care provider may ask you:  How you are feeling.  If you are feeling the baby move.  If you have had any abnormal symptoms, such as leaking fluid, bleeding, severe headaches, or abdominal cramping.  If you are using any tobacco products, including cigarettes, chewing tobacco, and electronic cigarettes.  If you have any questions. Other tests that may be performed during your second trimester include:  Blood tests that check for:  Low iron levels (anemia).  Gestational diabetes (between 24 and 28 weeks).  Rh antibodies.  Urine tests to check for infections, diabetes, or protein in the urine.  An ultrasound to confirm the proper growth and development of the baby.  An amniocentesis to check for possible genetic problems.  Fetal screens for spina bifida   and Down syndrome.  HIV (human immunodeficiency virus) testing. Routine prenatal testing includes screening for HIV, unless you choose not to have this test. HOME CARE INSTRUCTIONS   Avoid all smoking, herbs, alcohol, and unprescribed drugs. These chemicals affect the formation and growth of the baby.  Do not use any tobacco products, including cigarettes, chewing tobacco, and electronic cigarettes. If you need help quitting, ask your health care provider. You may receive  counseling support and other resources to help you quit.  Follow your health care provider's instructions regarding medicine use. There are medicines that are either safe or unsafe to take during pregnancy.  Exercise only as directed by your health care provider. Experiencing uterine cramps is a good sign to stop exercising.  Continue to eat regular, healthy meals.  Wear a good support bra for breast tenderness.  Do not use hot tubs, steam rooms, or saunas.  Wear your seat belt at all times when driving.  Avoid raw meat, uncooked cheese, cat litter boxes, and soil used by cats. These carry germs that can cause birth defects in the baby.  Take your prenatal vitamins.  Take 1500-2000 mg of calcium daily starting at the 20th week of pregnancy until you deliver your baby.  Try taking a stool softener (if your health care provider approves) if you develop constipation. Eat more high-fiber foods, such as fresh vegetables or fruit and whole grains. Drink plenty of fluids to keep your urine clear or pale yellow.  Take warm sitz baths to soothe any pain or discomfort caused by hemorrhoids. Use hemorrhoid cream if your health care provider approves.  If you develop varicose veins, wear support hose. Elevate your feet for 15 minutes, 3-4 times a day. Limit salt in your diet.  Avoid heavy lifting, wear low heel shoes, and practice good posture.  Rest with your legs elevated if you have leg cramps or low back pain.  Visit your dentist if you have not gone yet during your pregnancy. Use a soft toothbrush to brush your teeth and be gentle when you floss.  A sexual relationship may be continued unless your health care provider directs you otherwise.  Continue to go to all your prenatal visits as directed by your health care provider. SEEK MEDICAL CARE IF:   You have dizziness.  You have mild pelvic cramps, pelvic pressure, or nagging pain in the abdominal area.  You have persistent nausea,  vomiting, or diarrhea.  You have a bad smelling vaginal discharge.  You have pain with urination. SEEK IMMEDIATE MEDICAL CARE IF:   You have a fever.  You are leaking fluid from your vagina.  You have spotting or bleeding from your vagina.  You have severe abdominal cramping or pain.  You have rapid weight gain or loss.  You have shortness of breath with chest pain.  You notice sudden or extreme swelling of your face, hands, ankles, feet, or legs.  You have not felt your baby move in over an hour.  You have severe headaches that do not go away with medicine.  You have vision changes.   This information is not intended to replace advice given to you by your health care provider. Make sure you discuss any questions you have with your health care provider.   Document Released: 03/28/2001 Document Revised: 04/24/2014 Document Reviewed: 06/04/2012 Elsevier Interactive Patient Education [employer]   Thank you for enrolling in [facility]. Please follow the instructions below to securely access your online medical record. MyChart allows  you to send messages to your doctor, view your test results, renew your prescriptions, schedule appointments, and more.  How Do I Sign Up? 1. In your Internet browser, go to [email or web address] 2. Click on the New  User? link in the Sign In box.  3. Enter your MyChart Access Code exactly as it appears below. You will not need to use this code after you have completed the sign-up process. If you do not sign up before the expiration date, you must request a new code. MyChart Access Code: [email or web address] Expires: 06/13/2015  8:28 AM  4. Enter the last four digits of your Social Security Number (xxxx) and Date of Birth (mm/dd/yyyy) as indicated and click Next. You will be taken to the next sign-up page. 5. Create a MyChart ID. This will be your MyChart login ID and cannot be changed, so think of one that is secure and easy to  remember. 6. Create a MyChart password. You can change your password at any time. 7. Enter your Password Reset Question and Answer and click Next. This can be used at a later time if you forget your password.  8. Select your communication preference, and if applicable enter your e-mail address. You will receive e-mail notification when new information is available in MyChart by choosing to receive e-mail notifications and filling in your e-mail. 9. Click Sign In. You can now view your medical record.   Additional Information If you have questions, you can email [email or web address] or call [phone] to talk to our MyChart staff. Remember, MyChart is NOT to be used for urgent needs. For medical emergencies, dial 911.

## 2015-06-11 LAB — CYTOLOGY - PAP

## 2015-06-12 LAB — FIRST TRIMESTER SCREEN W/NT
CRL: 68.6 mm
DIA MoM: 0.95
DIA VALUE: 194.2 pg/mL
GEST AGE-COLLECT: 12.9 wk
HCG VALUE: 50.1 [IU]/mL
Maternal Age At EDD: 25.7 years
NUCHAL TRANSLUCENCY MOM: 1
Nuchal Translucency: 1.7 mm
Number of Fetuses: 1
PAPP-A MOM: 0.85
PAPP-A Value: 782.2 ng/mL
PDF: 0
TEST RESULTS: NEGATIVE
Weight: 178 [lb_av]
hCG MoM: 0.64

## 2015-06-15 ENCOUNTER — Telehealth: Payer: Self-pay | Admitting: *Deleted

## 2015-06-15 NOTE — Telephone Encounter (Signed)
-----   Message from Purcell Nails, [place] sent at 06/15/2015  2:30 PM EST ----- Please let her know NT was negative for increased risk

## 2015-06-15 NOTE — Telephone Encounter (Signed)
Notified pt of results 

## 2015-07-08 ENCOUNTER — Encounter: Payer: Self-pay | Admitting: Obstetrics and Gynecology

## 2015-07-08 ENCOUNTER — Ambulatory Visit (INDEPENDENT_AMBULATORY_CARE_PROVIDER_SITE_OTHER): Payer: Medicaid Other | Admitting: Obstetrics and Gynecology

## 2015-07-08 VITALS — BP 118/76 | HR 89 | Wt 185.3 lb

## 2015-07-08 DIAGNOSIS — Z331 Pregnant state, incidental: Secondary | ICD-10-CM

## 2015-07-08 LAB — POCT URINALYSIS DIPSTICK
Bilirubin, UA: NEGATIVE
Blood, UA: NEGATIVE
Glucose, UA: NEGATIVE
KETONES UA: NEGATIVE
LEUKOCYTES UA: NEGATIVE
Nitrite, UA: NEGATIVE
PROTEIN UA: NEGATIVE
Spec Grav, UA: <=1.005
Urobilinogen, UA: 0.2
pH, UA: 6

## 2015-07-08 NOTE — Progress Notes (Signed)
ROB- doing well except sore throat since yesterday, no fever- throat clear on exam and no lymphadenopathy. Anatomy scan next visit.

## 2015-07-08 NOTE — Progress Notes (Signed)
ROB- pt denies any complaints, she is having slight sore throat

## 2015-07-22 ENCOUNTER — Other Ambulatory Visit: Payer: Medicaid Other

## 2015-07-29 ENCOUNTER — Ambulatory Visit (INDEPENDENT_AMBULATORY_CARE_PROVIDER_SITE_OTHER): Payer: Medicaid Other

## 2015-07-29 DIAGNOSIS — Z331 Pregnant state, incidental: Secondary | ICD-10-CM | POA: Diagnosis not present

## 2015-08-04 ENCOUNTER — Other Ambulatory Visit: Payer: Self-pay | Admitting: Obstetrics and Gynecology

## 2015-08-04 DIAGNOSIS — Z0489 Encounter for examination and observation for other specified reasons: Secondary | ICD-10-CM

## 2015-08-04 DIAGNOSIS — IMO0002 Reserved for concepts with insufficient information to code with codable children: Secondary | ICD-10-CM

## 2015-08-06 ENCOUNTER — Ambulatory Visit (INDEPENDENT_AMBULATORY_CARE_PROVIDER_SITE_OTHER): Payer: Medicaid Other | Admitting: Obstetrics and Gynecology

## 2015-08-06 ENCOUNTER — Ambulatory Visit (INDEPENDENT_AMBULATORY_CARE_PROVIDER_SITE_OTHER): Payer: Medicaid Other

## 2015-08-06 ENCOUNTER — Encounter: Payer: Self-pay | Admitting: Obstetrics and Gynecology

## 2015-08-06 VITALS — BP 119/71 | HR 83 | Wt 186.7 lb

## 2015-08-06 DIAGNOSIS — IMO0002 Reserved for concepts with insufficient information to code with codable children: Secondary | ICD-10-CM

## 2015-08-06 DIAGNOSIS — Z0489 Encounter for examination and observation for other specified reasons: Secondary | ICD-10-CM

## 2015-08-06 DIAGNOSIS — Z331 Pregnant state, incidental: Secondary | ICD-10-CM

## 2015-08-06 DIAGNOSIS — Z36 Encounter for antenatal screening of mother: Secondary | ICD-10-CM

## 2015-08-06 LAB — POCT URINALYSIS DIPSTICK
Bilirubin, UA: NEGATIVE
Blood, UA: NEGATIVE
GLUCOSE UA: NEGATIVE
KETONES UA: NEGATIVE
Leukocytes, UA: NEGATIVE
Nitrite, UA: NEGATIVE
Spec Grav, UA: 1.015
Urobilinogen, UA: 0.2
pH, UA: 6

## 2015-08-06 NOTE — Progress Notes (Signed)
ROB- pt is c/o B feet swelling

## 2015-08-06 NOTE — Progress Notes (Signed)
ROB- doing well, LLP resolved on todays scan and anatomy complete,

## 2015-09-03 ENCOUNTER — Encounter: Payer: Self-pay | Admitting: Obstetrics and Gynecology

## 2015-09-03 ENCOUNTER — Ambulatory Visit (INDEPENDENT_AMBULATORY_CARE_PROVIDER_SITE_OTHER): Payer: Medicaid Other | Admitting: Obstetrics and Gynecology

## 2015-09-03 VITALS — BP 112/77 | HR 83 | Wt 191.0 lb

## 2015-09-03 DIAGNOSIS — Z3492 Encounter for supervision of normal pregnancy, unspecified, second trimester: Secondary | ICD-10-CM

## 2015-09-03 DIAGNOSIS — E049 Nontoxic goiter, unspecified: Secondary | ICD-10-CM

## 2015-09-03 LAB — POCT URINALYSIS DIPSTICK
Bilirubin, UA: NEGATIVE
Blood, UA: NEGATIVE
GLUCOSE UA: NEGATIVE
Ketones, UA: NEGATIVE
Leukocytes, UA: NEGATIVE
Nitrite, UA: NEGATIVE
PROTEIN UA: NEGATIVE
Spec Grav, UA: 1.01
UROBILINOGEN UA: 0.2
pH, UA: 7

## 2015-09-03 NOTE — Progress Notes (Signed)
ROB- thyroid enlarged on exam- u/s ordered, and will do TSH on next lab draw with glucola.

## 2015-09-03 NOTE — Progress Notes (Signed)
ROB- pt is doing well States the last several weeks she can feel her pulse in her throat

## 2015-09-07 ENCOUNTER — Ambulatory Visit
Admission: RE | Admit: 2015-09-07 | Discharge: 2015-09-07 | Disposition: A | Discharge status: Home / Self Care | Payer: Medicaid Other | Source: Ambulatory Visit | Attending: Obstetrics and Gynecology | Admitting: Obstetrics and Gynecology

## 2015-09-07 DIAGNOSIS — Z3492 Encounter for supervision of normal pregnancy, unspecified, second trimester: Secondary | ICD-10-CM | POA: Insufficient documentation

## 2015-09-07 DIAGNOSIS — E049 Nontoxic goiter, unspecified: Secondary | ICD-10-CM

## 2015-09-07 DIAGNOSIS — Z3A24 24 weeks gestation of pregnancy: Secondary | ICD-10-CM | POA: Diagnosis not present

## 2015-09-07 DIAGNOSIS — E079 Disorder of thyroid, unspecified: Secondary | ICD-10-CM | POA: Diagnosis not present

## 2015-09-14 ENCOUNTER — Telehealth: Payer: Self-pay | Admitting: *Deleted

## 2015-09-14 NOTE — Telephone Encounter (Signed)
Notified pt of results 

## 2015-09-14 NOTE — Telephone Encounter (Signed)
-----   Message from [place] Lakes[place]ody N [place], [place] sent at 09/10/2015  5:35 PM EDT ----- Please let her know thyroid ultrasound showed some enlargement but no nodules, we will follow it as pregnancy continues and then after delivery, but no worries now.

## 2015-09-24 ENCOUNTER — Encounter: Payer: Self-pay | Admitting: Obstetrics and Gynecology

## 2015-09-24 ENCOUNTER — Ambulatory Visit (INDEPENDENT_AMBULATORY_CARE_PROVIDER_SITE_OTHER): Payer: Medicaid Other | Admitting: Obstetrics and Gynecology

## 2015-09-24 ENCOUNTER — Other Ambulatory Visit: Payer: Medicaid Other

## 2015-09-24 VITALS — BP 111/84 | HR 87 | Wt 190.1 lb

## 2015-09-24 DIAGNOSIS — Z130 Encounter for screening for diseases of the blood and blood-forming organs and certain disorders involving the immune mechanism: Secondary | ICD-10-CM

## 2015-09-24 DIAGNOSIS — Z3492 Encounter for supervision of normal pregnancy, unspecified, second trimester: Secondary | ICD-10-CM | POA: Diagnosis not present

## 2015-09-24 DIAGNOSIS — Z131 Encounter for screening for diabetes mellitus: Secondary | ICD-10-CM

## 2015-09-24 DIAGNOSIS — Z23 Encounter for immunization: Secondary | ICD-10-CM | POA: Diagnosis not present

## 2015-09-24 DIAGNOSIS — E049 Nontoxic goiter, unspecified: Secondary | ICD-10-CM

## 2015-09-24 LAB — POCT URINALYSIS DIPSTICK
Bilirubin, UA: NEGATIVE
Blood, UA: NEGATIVE
GLUCOSE UA: NEGATIVE
Ketones, UA: NEGATIVE
NITRITE UA: NEGATIVE
PROTEIN UA: NEGATIVE
Spec Grav, UA: <=1.005
UROBILINOGEN UA: 0.2
pH, UA: 7.5

## 2015-09-24 MED ORDER — TETANUS-DIPHTH-ACELL PERTUSSIS 5-2.5-18.5 LF-MCG/0.5 IM SUSP
0.5000 mL | Freq: Once | INTRAMUSCULAR | Status: AC
  Administered 2015-09-24: 0.5 mL via INTRAMUSCULAR

## 2015-09-24 MED ORDER — RHO D IMMUNE GLOBULIN 1500 UNITS IM SOSY
1500.0000 [IU] | PREFILLED_SYRINGE | Freq: Once | INTRAMUSCULAR | Status: AC
  Administered 2015-09-24: 1500 [IU] via INTRAMUSCULAR

## 2015-09-24 NOTE — Progress Notes (Signed)
ROB-doing well, no concerns. 

## 2015-09-24 NOTE — Progress Notes (Signed)
ROB- GTT, btc, tdap, Rhogam and tsh.

## 2015-09-25 LAB — TSH: TSH: 1.04 u[IU]/mL (ref 0.450–4.500)

## 2015-09-25 LAB — GLUCOSE, 1 HOUR GESTATIONAL: GESTATIONAL DIABETES SCREEN: 134 mg/dL (ref 65–139)

## 2015-09-25 LAB — HEMOGLOBIN AND HEMATOCRIT, BLOOD
HEMATOCRIT: 37 % (ref 34.0–46.6)
HEMOGLOBIN: 12.2 g/dL (ref 11.1–15.9)

## 2015-10-13 ENCOUNTER — Encounter: Payer: Self-pay | Admitting: Obstetrics and Gynecology

## 2015-10-13 ENCOUNTER — Ambulatory Visit (INDEPENDENT_AMBULATORY_CARE_PROVIDER_SITE_OTHER): Payer: Medicaid Other | Admitting: Obstetrics and Gynecology

## 2015-10-13 VITALS — BP 121/82 | HR 87 | Wt 195.6 lb

## 2015-10-13 DIAGNOSIS — Z3493 Encounter for supervision of normal pregnancy, unspecified, third trimester: Secondary | ICD-10-CM

## 2015-10-13 DIAGNOSIS — L299 Pruritus, unspecified: Secondary | ICD-10-CM

## 2015-10-13 LAB — POCT URINALYSIS DIPSTICK
BILIRUBIN UA: NEGATIVE
Glucose, UA: NEGATIVE
KETONES UA: NEGATIVE
LEUKOCYTES UA: NEGATIVE
Nitrite, UA: NEGATIVE
PH UA: 6.5
Protein, UA: NEGATIVE
RBC UA: NEGATIVE
Spec Grav, UA: 1.01
Urobilinogen, UA: 0.2

## 2015-10-13 NOTE — Progress Notes (Signed)
ROB- pt states she is itching very badly, she has changed her shampoo, body wash, steadily gotten worse, worse in the evenings States its a burning sensation

## 2015-10-13 NOTE — Progress Notes (Signed)
ROB- will draw labs to rule out causes of itching- OK to restart antihystamine. Also will need note to start maternity leave Aug.2 as she will be leaving work to take car of other child as Mother in law (watches her child) is having foot surgery

## 2015-10-14 ENCOUNTER — Telehealth: Payer: Self-pay | Admitting: *Deleted

## 2015-10-14 LAB — BILE ACIDS, TOTAL: BILE ACIDS TOTAL: 4.6 umol/L — AB (ref 4.7–24.5)

## 2015-10-14 NOTE — Telephone Encounter (Signed)
-----   Message from Purcell NailsMelody N Shambley, [place] sent at 10/14/2015  3:21 PM EDT ----- Please let her know levels are not elevated, no worries on liver function

## 2015-10-14 NOTE — Telephone Encounter (Signed)
Notified pt of results 

## 2015-11-04 ENCOUNTER — Ambulatory Visit (INDEPENDENT_AMBULATORY_CARE_PROVIDER_SITE_OTHER): Payer: Medicaid Other | Admitting: Obstetrics and Gynecology

## 2015-11-04 ENCOUNTER — Encounter: Payer: Self-pay | Admitting: Obstetrics and Gynecology

## 2015-11-04 VITALS — BP 117/84 | HR 82 | Wt 199.8 lb

## 2015-11-04 DIAGNOSIS — Z3493 Encounter for supervision of normal pregnancy, unspecified, third trimester: Secondary | ICD-10-CM

## 2015-11-04 LAB — POCT URINALYSIS DIPSTICK
BILIRUBIN UA: NEGATIVE
Glucose, UA: NEGATIVE
Ketones, UA: NEGATIVE
Leukocytes, UA: NEGATIVE
NITRITE UA: NEGATIVE
PH UA: 6.5
Protein, UA: NEGATIVE
RBC UA: NEGATIVE
SPEC GRAV UA: 1.015
UROBILINOGEN UA: 0.2

## 2015-11-04 NOTE — Progress Notes (Signed)
ROB- pt is doing well 

## 2015-11-04 NOTE — Progress Notes (Signed)
ROB-doing well, itching is better.discussed previous labor- SROM 5d before due date and no complication in labor.

## 2015-11-22 ENCOUNTER — Other Ambulatory Visit: Payer: Self-pay | Admitting: *Deleted

## 2015-11-22 DIAGNOSIS — Z113 Encounter for screening for infections with a predominantly sexual mode of transmission: Secondary | ICD-10-CM

## 2015-11-22 DIAGNOSIS — Z3685 Encounter for antenatal screening for Streptococcus B: Secondary | ICD-10-CM

## 2015-11-22 DIAGNOSIS — Z3493 Encounter for supervision of normal pregnancy, unspecified, third trimester: Secondary | ICD-10-CM

## 2015-11-23 ENCOUNTER — Ambulatory Visit (INDEPENDENT_AMBULATORY_CARE_PROVIDER_SITE_OTHER): Payer: Medicaid Other | Admitting: Obstetrics and Gynecology

## 2015-11-23 VITALS — BP 116/83 | HR 76 | Wt 204.8 lb

## 2015-11-23 DIAGNOSIS — Z0289 Encounter for other administrative examinations: Secondary | ICD-10-CM

## 2015-11-23 DIAGNOSIS — Z3685 Encounter for antenatal screening for Streptococcus B: Secondary | ICD-10-CM

## 2015-11-23 DIAGNOSIS — Z113 Encounter for screening for infections with a predominantly sexual mode of transmission: Secondary | ICD-10-CM

## 2015-11-23 DIAGNOSIS — Z3493 Encounter for supervision of normal pregnancy, unspecified, third trimester: Secondary | ICD-10-CM

## 2015-11-23 DIAGNOSIS — Z36 Encounter for antenatal screening of mother: Secondary | ICD-10-CM

## 2015-11-23 LAB — POCT URINALYSIS DIPSTICK
Bilirubin, UA: NEGATIVE
Blood, UA: NEGATIVE
Glucose, UA: NEGATIVE
Ketones, UA: NEGATIVE
LEUKOCYTES UA: NEGATIVE
Nitrite, UA: NEGATIVE
PH UA: 7
PROTEIN UA: NEGATIVE
Spec Grav, UA: <=1.005
UROBILINOGEN UA: 0.2

## 2015-11-23 NOTE — Progress Notes (Signed)
ROB- discussed dizziness; cultures obtained, labor discussed

## 2015-11-23 NOTE — Addendum Note (Signed)
Addended by: Lunette StandsSIEMIENSKI, Caidyn Henricksen J on: 11/23/2015 02:54 PM   Modules accepted: Orders

## 2015-11-23 NOTE — Progress Notes (Signed)
ROB- cultures obtained, pt is having a lot of low back pain, feeling slight lightheaded the last few days

## 2015-11-25 LAB — GC/CHLAMYDIA PROBE AMP
Chlamydia trachomatis, NAA: NEGATIVE
NEISSERIA GONORRHOEAE BY PCR: NEGATIVE

## 2015-11-25 LAB — STREP GP B NAA: Strep Gp B NAA: NEGATIVE

## 2015-11-30 ENCOUNTER — Ambulatory Visit (INDEPENDENT_AMBULATORY_CARE_PROVIDER_SITE_OTHER): Payer: Medicaid Other | Admitting: Obstetrics and Gynecology

## 2015-11-30 VITALS — BP 122/82 | HR 74 | Wt 203.7 lb

## 2015-11-30 DIAGNOSIS — Z3493 Encounter for supervision of normal pregnancy, unspecified, third trimester: Secondary | ICD-10-CM

## 2015-11-30 LAB — POCT URINALYSIS DIPSTICK
BILIRUBIN UA: NEGATIVE
Blood, UA: NEGATIVE
GLUCOSE UA: NEGATIVE
KETONES UA: NEGATIVE
LEUKOCYTES UA: NEGATIVE
NITRITE UA: NEGATIVE
PH UA: 6.5
Protein, UA: NEGATIVE
Spec Grav, UA: 1.01
Urobilinogen, UA: 0.2

## 2015-11-30 NOTE — Progress Notes (Signed)
ROB- pt states she has been" leaking some watery fluid", lots of pelvic pressure, low back pain

## 2015-11-30 NOTE — Progress Notes (Signed)
ROB-NTZ & fern negative, reassured patient, GBS negative, discussed labor precautions

## 2015-12-07 ENCOUNTER — Ambulatory Visit (INDEPENDENT_AMBULATORY_CARE_PROVIDER_SITE_OTHER): Payer: Medicaid Other | Admitting: Obstetrics and Gynecology

## 2015-12-07 VITALS — BP 122/90 | HR 90 | Wt 210.7 lb

## 2015-12-07 DIAGNOSIS — Z3493 Encounter for supervision of normal pregnancy, unspecified, third trimester: Secondary | ICD-10-CM

## 2015-12-07 LAB — POCT URINALYSIS DIPSTICK
GLUCOSE UA: NEGATIVE
Ketones, UA: NEGATIVE
Leukocytes, UA: NEGATIVE
NITRITE UA: NEGATIVE
PH UA: 6.5
RBC UA: NEGATIVE
Spec Grav, UA: 1.015
UROBILINOGEN UA: 0.2

## 2015-12-07 NOTE — Progress Notes (Signed)
ROB- pt is having low back pain, denies headache

## 2015-12-07 NOTE — Progress Notes (Signed)
ROB- doing well, labor precautions discussed.  

## 2015-12-13 ENCOUNTER — Encounter: Payer: Self-pay | Admitting: Obstetrics and Gynecology

## 2015-12-14 ENCOUNTER — Ambulatory Visit (INDEPENDENT_AMBULATORY_CARE_PROVIDER_SITE_OTHER): Payer: Medicaid Other | Admitting: Obstetrics and Gynecology

## 2015-12-14 VITALS — BP 127/88 | HR 74 | Wt 205.8 lb

## 2015-12-14 DIAGNOSIS — Z3493 Encounter for supervision of normal pregnancy, unspecified, third trimester: Secondary | ICD-10-CM

## 2015-12-14 LAB — POCT URINALYSIS DIPSTICK
Bilirubin, UA: NEGATIVE
Blood, UA: NEGATIVE
Glucose, UA: NEGATIVE
KETONES UA: 5
LEUKOCYTES UA: NEGATIVE
Nitrite, UA: NEGATIVE
Spec Grav, UA: 1.01
UROBILINOGEN UA: 0.2
pH, UA: 7

## 2015-12-14 NOTE — Progress Notes (Signed)
Pt is also having some headaches

## 2015-12-14 NOTE — Progress Notes (Signed)
ROB- labor precautions discussed, will consider IOL at term if not delivered by then.

## 2015-12-14 NOTE — Progress Notes (Signed)
ROB- pt is having a lot of pressure, some contractions

## 2015-12-15 ENCOUNTER — Inpatient Hospital Stay: Payer: Medicaid Other | Admitting: Registered Nurse

## 2015-12-15 ENCOUNTER — Inpatient Hospital Stay
Admission: EM | Admit: 2015-12-15 | Discharge: 2015-12-17 | DRG: 775 | Disposition: A | Discharge status: Home / Self Care | Payer: Medicaid Other | Attending: Obstetrics and Gynecology | Admitting: Obstetrics and Gynecology

## 2015-12-15 DIAGNOSIS — Z8249 Family history of ischemic heart disease and other diseases of the circulatory system: Secondary | ICD-10-CM

## 2015-12-15 DIAGNOSIS — Z833 Family history of diabetes mellitus: Secondary | ICD-10-CM

## 2015-12-15 DIAGNOSIS — Z79899 Other long term (current) drug therapy: Secondary | ICD-10-CM | POA: Diagnosis not present

## 2015-12-15 DIAGNOSIS — Z800 Family history of malignant neoplasm of digestive organs: Secondary | ICD-10-CM | POA: Diagnosis not present

## 2015-12-15 DIAGNOSIS — O9852 Other viral diseases complicating childbirth: Secondary | ICD-10-CM | POA: Diagnosis present

## 2015-12-15 DIAGNOSIS — Z3483 Encounter for supervision of other normal pregnancy, third trimester: Secondary | ICD-10-CM | POA: Diagnosis not present

## 2015-12-15 DIAGNOSIS — Z3A39 39 weeks gestation of pregnancy: Secondary | ICD-10-CM

## 2015-12-15 LAB — CBC
HEMATOCRIT: 37.8 % (ref 35.0–47.0)
HEMOGLOBIN: 13 g/dL (ref 12.0–16.0)
MCH: 27.7 pg (ref 26.0–34.0)
MCHC: 34.3 g/dL (ref 32.0–36.0)
MCV: 80.9 fL (ref 80.0–100.0)
Platelets: 177 10*3/uL (ref 150–440)
RBC: 4.67 MIL/uL (ref 3.80–5.20)
RDW: 16.6 % — ABNORMAL HIGH (ref 11.5–14.5)
WBC: 10.4 10*3/uL (ref 3.6–11.0)

## 2015-12-15 LAB — TYPE AND SCREEN
ABO/RH(D): A NEG
ANTIBODY SCREEN: NEGATIVE

## 2015-12-15 MED ORDER — LIDOCAINE HCL (PF) 1 % IJ SOLN
INTRAMUSCULAR | Status: AC
  Filled 2015-12-15: qty 30

## 2015-12-15 MED ORDER — OXYTOCIN 40 UNITS IN LACTATED RINGERS INFUSION - SIMPLE MED
1.0000 m[IU]/min | INTRAVENOUS | Status: DC
  Administered 2015-12-15: 1 m[IU]/min via INTRAVENOUS
  Filled 2015-12-15: qty 1000

## 2015-12-15 MED ORDER — SOD CITRATE-CITRIC ACID 500-334 MG/5ML PO SOLN: 30.0000 mL | ORAL | Status: DC | PRN

## 2015-12-15 MED ORDER — FENTANYL CITRATE (PF) 100 MCG/2ML IJ SOLN: 50.0000 ug | INTRAMUSCULAR | Status: DC | PRN

## 2015-12-15 MED ORDER — IBUPROFEN 600 MG PO TABS
600.0000 mg | ORAL_TABLET | Freq: Four times a day (QID) | ORAL | Status: DC
  Administered 2015-12-15 – 2015-12-17 (×7): 600 mg via ORAL
  Filled 2015-12-15 (×7): qty 1

## 2015-12-15 MED ORDER — MISOPROSTOL 200 MCG PO TABS
ORAL_TABLET | ORAL | Status: AC
  Filled 2015-12-15: qty 4

## 2015-12-15 MED ORDER — WITCH HAZEL-GLYCERIN EX PADS: 1.0000 "application " | MEDICATED_PAD | CUTANEOUS | Status: DC | PRN

## 2015-12-15 MED ORDER — DIPHENHYDRAMINE HCL 25 MG PO CAPS: 25.0000 mg | ORAL_CAPSULE | Freq: Four times a day (QID) | ORAL | Status: DC | PRN

## 2015-12-15 MED ORDER — MEASLES, MUMPS & RUBELLA VAC ~~LOC~~ INJ
0.5000 mL | INJECTION | Freq: Once | SUBCUTANEOUS | Status: AC
  Administered 2015-12-17: 0.5 mL via SUBCUTANEOUS
  Filled 2015-12-15 (×2): qty 0.5

## 2015-12-15 MED ORDER — LIDOCAINE HCL (PF) 1 % IJ SOLN
INTRAMUSCULAR | Status: DC | PRN
  Administered 2015-12-15: 3 mL via SUBCUTANEOUS

## 2015-12-15 MED ORDER — LACTATED RINGERS IV SOLN: 500.0000 mL | INTRAVENOUS | Status: DC | PRN

## 2015-12-15 MED ORDER — LACTATED RINGERS IV SOLN
INTRAVENOUS | Status: DC
Start: 2015-12-15 — End: 2015-12-15
  Administered 2015-12-15: 07:00:00 via INTRAVENOUS

## 2015-12-15 MED ORDER — ACETAMINOPHEN 325 MG PO TABS: 650.0000 mg | ORAL_TABLET | ORAL | Status: DC | PRN

## 2015-12-15 MED ORDER — BENZOCAINE-MENTHOL 20-0.5 % EX AERO: 1.0000 "application " | INHALATION_SPRAY | CUTANEOUS | Status: DC | PRN

## 2015-12-15 MED ORDER — FENTANYL 2.5 MCG/ML W/ROPIVACAINE 0.2% IN NS 100 ML EPIDURAL INFUSION (ARMC-ANES)
EPIDURAL | Status: AC
  Filled 2015-12-15: qty 100

## 2015-12-15 MED ORDER — OXYTOCIN 40 UNITS IN LACTATED RINGERS INFUSION - SIMPLE MED
2.5000 [IU]/h | INTRAVENOUS | Status: DC
Start: 2015-12-15 — End: 2015-12-15
  Administered 2015-12-15: 2.5 [IU]/h via INTRAVENOUS

## 2015-12-15 MED ORDER — DOCUSATE SODIUM 100 MG PO CAPS
100.0000 mg | ORAL_CAPSULE | Freq: Two times a day (BID) | ORAL | Status: DC
  Administered 2015-12-16 – 2015-12-17 (×3): 100 mg via ORAL
  Filled 2015-12-15 (×3): qty 1

## 2015-12-15 MED ORDER — SENNOSIDES-DOCUSATE SODIUM 8.6-50 MG PO TABS
2.0000 | ORAL_TABLET | ORAL | Status: DC
  Administered 2015-12-17: 2 via ORAL
  Filled 2015-12-15: qty 2

## 2015-12-15 MED ORDER — ONDANSETRON HCL 4 MG/2ML IJ SOLN: 4.0000 mg | INTRAMUSCULAR | Status: DC | PRN

## 2015-12-15 MED ORDER — SIMETHICONE 80 MG PO CHEW: 80.0000 mg | CHEWABLE_TABLET | ORAL | Status: DC | PRN

## 2015-12-15 MED ORDER — OXYTOCIN 10 UNIT/ML IJ SOLN
INTRAMUSCULAR | Status: AC
  Filled 2015-12-15: qty 2

## 2015-12-15 MED ORDER — DIBUCAINE 1 % RE OINT: 1.0000 "application " | TOPICAL_OINTMENT | RECTAL | Status: DC | PRN

## 2015-12-15 MED ORDER — OXYCODONE-ACETAMINOPHEN 5-325 MG PO TABS: 1.0000 | ORAL_TABLET | ORAL | Status: DC | PRN

## 2015-12-15 MED ORDER — BUPIVACAINE HCL (PF) 0.25 % IJ SOLN
INTRAMUSCULAR | Status: DC | PRN
  Administered 2015-12-15 (×2): 5 mL via EPIDURAL

## 2015-12-15 MED ORDER — ONDANSETRON HCL 4 MG PO TABS: 4.0000 mg | ORAL_TABLET | ORAL | Status: DC | PRN

## 2015-12-15 MED ORDER — PRENATAL MULTIVITAMIN CH
1.0000 | ORAL_TABLET | Freq: Every day | ORAL | Status: DC
  Administered 2015-12-16: 1 via ORAL
  Filled 2015-12-15: qty 1

## 2015-12-15 MED ORDER — OXYCODONE-ACETAMINOPHEN 5-325 MG PO TABS: 2.0000 | ORAL_TABLET | ORAL | Status: DC | PRN

## 2015-12-15 MED ORDER — VARICELLA VIRUS VACCINE LIVE 1350 PFU/0.5ML IJ SUSR
0.5000 mL | Freq: Once | INTRAMUSCULAR | Status: AC
  Administered 2015-12-17: 0.5 mL via SUBCUTANEOUS
  Filled 2015-12-15 (×2): qty 0.5

## 2015-12-15 MED ORDER — TERBUTALINE SULFATE 1 MG/ML IJ SOLN: 0.2500 mg | Freq: Once | INTRAMUSCULAR | Status: DC | PRN

## 2015-12-15 MED ORDER — AMMONIA AROMATIC IN INHA
RESPIRATORY_TRACT | Status: AC
  Filled 2015-12-15: qty 10

## 2015-12-15 MED ORDER — COCONUT OIL OIL: 1.0000 "application " | TOPICAL_OIL | Status: DC | PRN

## 2015-12-15 MED ORDER — LIDOCAINE HCL (PF) 1 % IJ SOLN: 30.0000 mL | INTRAMUSCULAR | Status: DC | PRN

## 2015-12-15 MED ORDER — FENTANYL 2.5 MCG/ML W/ROPIVACAINE 0.2% IN NS 100 ML EPIDURAL INFUSION (ARMC-ANES)
EPIDURAL | Status: DC | PRN
  Administered 2015-12-15: 10 mL/h via EPIDURAL

## 2015-12-15 MED ORDER — LIDOCAINE-EPINEPHRINE (PF) 1.5 %-1:200000 IJ SOLN
INTRAMUSCULAR | Status: DC | PRN
  Administered 2015-12-15: 3 mL via EPIDURAL

## 2015-12-15 MED ORDER — ONDANSETRON HCL 4 MG/2ML IJ SOLN: 4.0000 mg | Freq: Four times a day (QID) | INTRAMUSCULAR | Status: DC | PRN

## 2015-12-15 MED ORDER — OXYTOCIN BOLUS FROM INFUSION
500.0000 mL | Freq: Once | INTRAVENOUS | Status: AC
  Administered 2015-12-15: 500 mL via INTRAVENOUS

## 2015-12-15 NOTE — H&P (Signed)
Obstetric History and Physical  Natasha FlattenKathleen Lowery is a 25 y.o. G2P1001 with IUP at 7473w4d presenting with regular contractions. Patient states she has been having  irregular, every 3-4 minutes contractions, none vaginal bleeding, intact membranes, with active fetal movement.    [facility]  Pregnancy complications or risks:none  Prenatal labs and studies: ABO, Rh: --/--/A NEG (08/30 0708) Antibody: NEG (08/30 0708) Rubella: <20.0 (01/26 1445) RPR: Non Reactive (01/26 1445)  HBsAg: Negative (01/26 1445)  HIV: Non Reactive (01/26 1445)  ZOX:WRUEAVWUGBS:Negative (08/08 1333) 1 hr Glucola  normal Genetic screening normal Anatomy US normal  Past Medical History:  Diagnosis Date  . Heavy periods   . Irregular periods     Past Surgical History:  Procedure Laterality Date  . none      OB History  Gravida Para Term Preterm AB Living  2 1 1     1   SAB TAB Ectopic Multiple Live Births          1    # Outcome Date GA Lbr Len/2nd Weight Sex Delivery Anes PTL Lv  2 Current           1 Term 2014   6 lb 4.8 oz (2.858 kg) M Vag-Spont   LIV      Social History   Social History  . Marital status: Married    Spouse name: N/A  . Number of children: N/A  . Years of education: N/A   Social History Main Topics  . Smoking status: Never Smoker  . Smokeless tobacco: Never Used  . Alcohol use Yes     Comment: rare  . Drug use: No  . Sexual activity: Yes    Birth control/ protection: None   Other Topics Concern  . None   Social History Narrative  . None    Family History  Problem Relation Age of Onset  . Colon cancer Maternal Grandmother     grt mat gm  . Lupus Maternal Grandmother     deceased due to Lupus  . Diabetes Maternal Grandfather   . Diabetes Paternal Grandmother   . Heart disease Paternal Grandmother   . Lupus Mother     in remission  . Breast cancer Neg Hx   . Ovarian cancer Neg Hx     Prescriptions Prior to Admission  Medication Sig Dispense  Refill Last Dose  . Prenat-FeFum-FePo-FA-Omega 3 (CONCEPT DHA) 53.5-38-1 MG CAPS Take 1 tablet by mouth daily. 30 capsule 11 Taking    No Known Allergies  Review of Systems: Negative except for what is mentioned in HPI.  Physical Exam: BP 126/73   Pulse 99   Temp 98.1 F (36.7 C) (Oral)   Resp 20   Ht 5\' 6"  (1.676 m)   Wt 205 lb (93 kg)   LMP 02/20/2015 (Approximate)   SpO2 97%   BMI 33.09 kg/m  GENERAL: Well-developed, well-nourished female in no acute distress.  LUNGS: Clear to auscultation bilaterally.  HEART: Regular rate and rhythm. ABDOMEN: Soft, nontender, nondistended, gravid. EXTREMITIES: Nontender, no edema, 2+ distal pulses. Cervical Exam: Dilation: 5 Effacement (%): 90 Cervical Position: Posterior Station: -1 Exam by:: M.Shambley, CNM, AROM with moderate amount clear fluid FHT:  Baseline rate 148 bpm   Variability moderate  Accelerations present   Decelerations none Contractions: Every 3-4 mins   Pertinent Labs/Studies:   Results for orders placed or performed during the hospital encounter of 12/15/15 (from the past 24 hour(s))  CBC  Status: Abnormal   Collection Time: 12/15/15  7:08 AM  Result Value Ref Range   WBC 10.4 3.6 - 11.0 K/uL   RBC 4.67 3.80 - 5.20 MIL/uL   Hemoglobin 13.0 12.0 - 16.0 g/dL   HCT 74.2 59.5 - 63.8 %   MCV 80.9 80.0 - 100.0 fL   MCH 27.7 26.0 - 34.0 pg   MCHC 34.3 32.0 - 36.0 g/dL   RDW 75.6 (H) 43.3 - 29.5 %   Platelets 177 150 - 440 K/uL  Type and screen     Status: None   Collection Time: 12/15/15  7:08 AM  Result Value Ref Range   ABO/RH(D) A NEG    Antibody Screen NEG    Sample Expiration 12/18/2015     Assessment : Natasha Lowery is a 25 y.o. G2P1001 at [redacted]w[redacted]d being admitted for labor.  Plan: Labor: Expectant management.  Induction/Augmentation as needed, per protocol FWB: Reassuring fetal heart tracing.  GBS negative Delivery plan: Hopeful for vaginal delivery  Melody Shambley, CNM [facility],  [facility]

## 2015-12-15 NOTE — Anesthesia Procedure Notes (Signed)
Epidural Patient location during procedure: OB Start time: 12/15/2015 7:52 AM End time: 12/15/2015 8:00 AM  Staffing Anesthesiologist: Priscella MannPENWARDEN, AMY Resident/CRNA: NOLES, MARK  Preanesthetic Checklist Completed: patient identified, site marked, surgical consent, pre-op evaluation, timeout performed, IV checked, risks and benefits discussed and monitors and equipment checked  Epidural Patient position: sitting Prep: Betadine Patient monitoring: heart rate, continuous pulse ox and blood pressure Approach: midline Location: L4-L5 Injection technique: LOR saline  Needle:  Needle type: Tuohy  Needle gauge: 17 G Needle length: 9 cm and 9 Needle insertion depth: 7 cm Catheter type: closed end flexible Catheter size: 20 Guage Catheter at skin depth: 12 cm Test dose: negative and 1.5% lidocaine with Epi 1:200 K  Assessment Sensory level: T10 Events: blood not aspirated, injection not painful, no injection resistance, negative IV test and no paresthesia  Additional Notes Pt. Evaluated and documentation done after procedure finished. Patient identified. Risks/Benefits/Options discussed with patient including but not limited to bleeding, infection, nerve damage, paralysis, failed block, incomplete pain control, headache, blood pressure changes, nausea, vomiting, reactions to medication both or allergic, itching and postpartum back pain. Confirmed with bedside nurse the patient's most recent platelet count. Confirmed with patient that they are not currently taking any anticoagulation, have any bleeding history or any family history of bleeding disorders. Patient expressed understanding and wished to proceed. All questions were answered. Sterile technique was used throughout the entire procedure. Please see nursing notes for vital signs. Test dose was given through epidural catheter and negative prior to continuing to dose epidural or start infusion. Warning signs of high block given to the  patient including shortness of breath, tingling/numbness in hands, complete motor block, or any concerning symptoms with instructions to call for help. Patient was given instructions on fall risk and not to get out of bed. All questions and concerns addressed with instructions to call with any issues or inadequate analgesia.   Patient tolerated the insertion well without complications.  Reason for block:procedure for pain

## 2015-12-15 NOTE — Anesthesia Preprocedure Evaluation (Signed)
Anesthesia Evaluation  Patient identified by MRN, date of birth, ID band Patient awake    Reviewed: Allergy & Precautions, NPO status , Patient's Chart, lab work & pertinent test results  History of Anesthesia Complications Negative for: history of anesthetic complications  Airway Mallampati: II  TM Distance: >3 FB Neck ROM: Full    Dental no notable dental hx.    Pulmonary neg pulmonary ROS, neg sleep apnea, neg COPD,    breath sounds clear to auscultation- rhonchi (-) wheezing      Cardiovascular Exercise Tolerance: Good (-) hypertension(-) CAD and (-) Past MI  Rhythm:Regular Rate:Normal - Systolic murmurs and - Diastolic murmurs    Neuro/Psych negative neurological ROS  negative psych ROS   GI/Hepatic negative GI ROS, Neg liver ROS,   Endo/Other  negative endocrine ROSneg diabetes  Renal/GU negative Renal ROS     Musculoskeletal negative musculoskeletal ROS (+)   Abdominal Gravid abdomen   Peds  Hematology negative hematology ROS (+)   Anesthesia Other Findings [id number] presenting in labor  Reproductive/Obstetrics                             Anesthesia Physical Anesthesia Plan  ASA: II  Anesthesia Plan: Epidural   Post-op Pain Management:    Induction:   Airway Management Planned:   Additional Equipment:   Intra-op Plan:   Post-operative Plan:   Informed Consent: I have reviewed the patients History and Physical, chart, labs and discussed the procedure including the risks, benefits and alternatives for the proposed anesthesia with the patient or authorized representative who has indicated his/her understanding and acceptance.     Plan Discussed with: CRNA and Anesthesiologist  Anesthesia Plan Comments:         Lab Results  Component Value Date   WBC 10.4 12/15/2015   HGB 13.0 12/15/2015   HCT 37.8 12/15/2015   MCV 80.9 12/15/2015   PLT 177 12/15/2015     Anesthesia Quick Evaluation

## 2015-12-16 LAB — CBC
HEMATOCRIT: 37 % (ref 35.0–47.0)
Hemoglobin: 12.7 g/dL (ref 12.0–16.0)
MCH: 28.2 pg (ref 26.0–34.0)
MCHC: 34.3 g/dL (ref 32.0–36.0)
MCV: 82.1 fL (ref 80.0–100.0)
Platelets: 161 10*3/uL (ref 150–440)
RBC: 4.5 MIL/uL (ref 3.80–5.20)
RDW: 16.5 % — AB (ref 11.5–14.5)
WBC: 10.5 10*3/uL (ref 3.6–11.0)

## 2015-12-16 LAB — TSH: TSH: 1.815 u[IU]/mL (ref 0.350–4.500)

## 2015-12-16 LAB — FETAL SCREEN: Fetal Screen: NEGATIVE

## 2015-12-16 LAB — RPR: RPR Ser Ql: NONREACTIVE

## 2015-12-16 MED ORDER — RHO D IMMUNE GLOBULIN 1500 UNIT/2ML IJ SOSY
300.0000 ug | PREFILLED_SYRINGE | Freq: Once | INTRAMUSCULAR | Status: AC
  Administered 2015-12-16: 300 ug via INTRAMUSCULAR
  Filled 2015-12-16: qty 2

## 2015-12-16 NOTE — Anesthesia Postprocedure Evaluation (Signed)
Anesthesia Post Note  Patient: Laurance FlattenKathleen Lantz  Procedure(s) Performed: * No procedures listed *  Patient location during evaluation: Mother Baby Anesthesia Type: Epidural Level of consciousness: awake, awake and alert and oriented Pain management: pain level controlled Vital Signs Assessment: post-procedure vital signs reviewed and stable Respiratory status: spontaneous breathing, nonlabored ventilation and respiratory function stable Cardiovascular status: stable Anesthetic complications: no Comments: Site is clean, dry, intact.    Last Vitals:  Vitals:   12/16/15 0022 12/16/15 0419  BP: 127/65 126/83  Pulse: 67 84  Resp: 20   Temp: 36.6 C 36.7 C    Last Pain:  Vitals:   12/16/15 0419  TempSrc: Oral  PainSc:                  Casey Burkitthuy Daijah Scrivens

## 2015-12-16 NOTE — Progress Notes (Signed)
Post Partum Day 1 Subjective: no complaints and up ad lib  Objective: Blood pressure 134/87, pulse 78, temperature 98.2 F (36.8 C), temperature source Oral, resp. rate 18, height 5\' 6"  (1.676 m), weight 205 lb (93 kg), last menstrual period 02/20/2015, SpO2 99 %.  Physical Exam:  General: alert, cooperative and appears stated age Lochia: appropriate Uterine Fundus: firm Incision: NA DVT Evaluation: No evidence of DVT seen on physical exam. Negative Homan's sign.   Recent Labs  12/15/15 0708 12/16/15 0509  HGB 13.0 12.7  HCT 37.8 37.0    Assessment/Plan: Plan for discharge tomorrow, Breastfeeding and Circumcision prior to discharge Infant feeding only Breast, blood type A+- will need rhogam    LOS: 1 day   [employer] MicrosystemsMelody N Danyela Posas, CNM 12/16/2015, 10:25 AM

## 2015-12-17 ENCOUNTER — Encounter: Payer: Self-pay | Admitting: *Deleted

## 2015-12-17 LAB — RHOGAM INJECTION: UNIT DIVISION: 0

## 2015-12-17 MED ORDER — VITAMIN D3 125 MCG (5000 UT) PO CAPS: 1.0000 | ORAL_CAPSULE | Freq: Every day | ORAL | 2 refills | Status: DC

## 2015-12-17 NOTE — Progress Notes (Signed)
Pt discharged home with infant.  Discharge instructions and follow up appointment given to and reviewed with pt.  Pt verbalized understanding.  Escorted by auxillary. 

## 2015-12-17 NOTE — Discharge Summary (Signed)
Obstetric Discharge Summary Reason for Admission: onset of labor Prenatal Procedures: ultrasound Intrapartum Procedures: spontaneous vaginal delivery Postpartum Procedures: Rubella Ig and varicella & Rhogam Complications-Operative and Postpartum: none Hemoglobin  Date Value Ref Range Status  12/16/2015 12.7 12.0 - 16.0 g/dL Final   HCT  Date Value Ref Range Status  12/16/2015 37.0 35.0 - 47.0 % Final   Hematocrit  Date Value Ref Range Status  09/24/2015 37.0 34.0 - 46.6 % Final    Physical Exam:  General: alert, cooperative, appears stated age and pale Lochia: appropriate Uterine Fundus: firm Incision: NA DVT Evaluation: No evidence of DVT seen on physical exam. Negative Homan's sign.  Discharge Diagnoses: Term Pregnancy-delivered  Discharge Information: Date: 12/17/2015 Activity: pelvic rest Diet: routine Medications: PNV, Ibuprofen and Colace, plans nexplanon PP Condition: stable Instructions: refer to practice specific booklet Discharge to: home   Newborn Data: Live born female "Richardson DoppCole" Birth Weight: 8 lb 13.1 oz (4000 g) APGAR: 8, 9  Home with mother.  Ciarrah Rae N Emrick Hensch 12/17/2015, 8:41 AM

## 2016-01-19 ENCOUNTER — Ambulatory Visit (INDEPENDENT_AMBULATORY_CARE_PROVIDER_SITE_OTHER): Payer: Medicaid Other | Admitting: Obstetrics and Gynecology

## 2016-01-19 ENCOUNTER — Encounter: Payer: Self-pay | Admitting: Obstetrics and Gynecology

## 2016-01-19 VITALS — BP 121/83 | HR 77 | Wt 179.2 lb

## 2016-01-19 DIAGNOSIS — Z30017 Encounter for initial prescription of implantable subdermal contraceptive: Secondary | ICD-10-CM

## 2016-01-19 MED ORDER — ETONOGESTREL 68 MG ~~LOC~~ IMPL: 68.0000 mg | DRUG_IMPLANT | Freq: Once | SUBCUTANEOUS | Status: AC

## 2016-01-19 NOTE — Progress Notes (Signed)
Laurance FlattenKathleen Lowery is a 25 y.o. year old Caucasian female here for Nexplanon insertion.  No LMP recorded., last sexual intercourse was before delivery, and her pregnancy test today was negative.  Risks/benefits/side effects of Nexplanon have been discussed and her questions have been answered.  Specifically, a failure rate of 04/998 has been reported, with an increased failure rate if pt takes St. John's Wort and/or antiseizure medicaitons.  Laurance FlattenKathleen Lowery is aware of the common side effect of irregular bleeding, which the incidence of decreases over time.  BP 121/83   Pulse 77   Wt 179 lb 3.2 oz (81.3 kg)   Breastfeeding? Yes   BMI 28.92 kg/m   No results found for this or any previous visit (from the past 24 hour(s)).   She is left-handed, so her right arm, approximately 4 inches proximal from the elbow, was cleansed with alcohol and anesthetized with 2cc of 2% Lidocaine.  The area was cleansed again with betadine and the Nexplanon was inserted per manufacturer's recommendations without difficulty.  A steri-strip and pressure bandage were applied.  Pt was instructed to keep the area clean and dry, remove pressure bandage in 24 hours, and keep insertion site covered with the steri-strip for 3-5 days.  Back up contraception was recommended for 2 weeks.  She was given a card indicating date Nexplanon was inserted and date it needs to be removed. Follow-up PRN problems.  Dani Wallner,CNM

## 2016-01-27 ENCOUNTER — Ambulatory Visit (INDEPENDENT_AMBULATORY_CARE_PROVIDER_SITE_OTHER): Payer: Medicaid Other | Admitting: Obstetrics and Gynecology

## 2016-01-27 ENCOUNTER — Encounter: Payer: Self-pay | Admitting: Obstetrics and Gynecology

## 2016-01-27 NOTE — Patient Instructions (Signed)
  Place postpartum visit patient instructions here.  

## 2016-01-27 NOTE — Progress Notes (Signed)
   Subjective:     Natasha Lowery is a 25 y.o. female who presents for a postpartum visit. She is 6 weeks postpartum following a spontaneous vaginal delivery. I have fully reviewed the prenatal and intrapartum course. The delivery was at 40 gestational weeks. Outcome: spontaneous vaginal delivery. Anesthesia: epidural. Postpartum course has been uncomplicated. Baby's course has been uncomplicated. Baby is feeding by breast. Bleeding on menses now, normal dark red blood noted. Bowel function is normal. Bladder function is normal. Patient is sexually active. Contraception method is Nexplanon. Postpartum depression screening: negative.  The following portions of the patient's history were reviewed and updated as appropriate: allergies, current medications, past family history, past medical history, past social history, past surgical history and problem list.  Review of Systems A comprehensive review of systems was negative.   Objective:    BP 106/83   Pulse 67   Ht 5\' 6"  (1.676 m)   Wt 180 lb 12.8 oz (82 kg)   LMP 01/25/2016   Breastfeeding? Yes   BMI 29.18 kg/m   General:  alert, cooperative and appears stated age   Breasts:  not examined  Lungs: clear to auscultation bilaterally  Heart:  regular rate and rhythm, S1, S2 normal, no murmur, click, rub or gallop  Abdomen: soft, non-tender; bowel sounds normal; no masses,  no organomegaly   Vulva:  normal  Vagina: normal vagina, no discharge, exudate, lesion, or erythema  Cervix:  multiparous appearance  Corpus: normal size, contour, position, consistency, mobility, non-tender  Adnexa:  no mass, fullness, tenderness  Rectal Exam: Not performed.        Assessment:     6 week postpartum exam. Pap smear not done at today's visit.   Plan:    1. Contraception: Nexplanon 2. Follow up in: 4 months or as needed.

## 2016-01-28 LAB — THYROID PANEL WITH TSH
FREE THYROXINE INDEX: 2.1 (ref 1.2–4.9)
T3 UPTAKE RATIO: 31 % (ref 24–39)
T4 TOTAL: 6.7 ug/dL (ref 4.5–12.0)
TSH: 1.48 u[IU]/mL (ref 0.450–4.500)

## 2016-01-28 LAB — CBC
HEMOGLOBIN: 13.2 g/dL (ref 11.1–15.9)
Hematocrit: 40.5 % (ref 34.0–46.6)
MCH: 26.2 pg — AB (ref 26.6–33.0)
MCHC: 32.6 g/dL (ref 31.5–35.7)
MCV: 81 fL (ref 79–97)
PLATELETS: 283 10*3/uL (ref 150–379)
RBC: 5.03 x10E6/uL (ref 3.77–5.28)
RDW: 16.1 % — ABNORMAL HIGH (ref 12.3–15.4)
WBC: 4.9 10*3/uL (ref 3.4–10.8)

## 2016-01-28 LAB — VITAMIN D 25 HYDROXY (VIT D DEFICIENCY, FRACTURES): VIT D 25 HYDROXY: 36.1 ng/mL (ref 30.0–100.0)

## 2016-01-28 LAB — IRON: Iron: 96 ug/dL (ref 27–159)

## 2016-02-10 ENCOUNTER — Encounter: Payer: Self-pay | Admitting: Obstetrics and Gynecology

## 2016-02-21 ENCOUNTER — Encounter: Payer: Self-pay | Admitting: Obstetrics and Gynecology

## 2016-04-27 ENCOUNTER — Encounter: Payer: Self-pay | Admitting: Obstetrics and Gynecology

## 2016-04-27 ENCOUNTER — Ambulatory Visit (INDEPENDENT_AMBULATORY_CARE_PROVIDER_SITE_OTHER): Payer: Medicaid Other | Admitting: Obstetrics and Gynecology

## 2016-04-27 ENCOUNTER — Other Ambulatory Visit: Payer: Self-pay | Admitting: Obstetrics and Gynecology

## 2016-04-27 VITALS — BP 125/79 | HR 91 | Ht 66.0 in | Wt 179.8 lb

## 2016-04-27 DIAGNOSIS — Z0000 Encounter for general adult medical examination without abnormal findings: Secondary | ICD-10-CM

## 2016-04-27 DIAGNOSIS — Z01419 Encounter for gynecological examination (general) (routine) without abnormal findings: Secondary | ICD-10-CM

## 2016-04-27 DIAGNOSIS — N912 Amenorrhea, unspecified: Secondary | ICD-10-CM

## 2016-04-27 NOTE — Progress Notes (Signed)
  Subjective:     Natasha Lowery is a 26 y.o. female and is here for a comprehensive physical exam. The patient reports no problems.  Social History   Social History  . Marital status: Married    Spouse name: N/A  . Number of children: N/A  . Years of education: N/A   Occupational History  . Not on file.   Social History Main Topics  . Smoking status: Never Smoker  . Smokeless tobacco: Never Used  . Alcohol use Yes     Comment: rare  . Drug use: No  . Sexual activity: Yes    Birth control/ protection: None, Implant   Other Topics Concern  . Not on file   Social History Narrative  . No narrative on file   Health Maintenance  Topic Date Due  . INFLUENZA VACCINE  11/16/2015  . PAP SMEAR  06/09/2018  . TETANUS/TDAP  09/23/2025  . HIV Screening  Completed    The following portions of the patient's history were reviewed and updated as appropriate: allergies, current medications, past family history, past medical history, past social history, past surgical history and problem list.  Review of Systems A comprehensive review of systems was negative.   Objective:    General appearance: alert, cooperative and appears stated age Neck: no adenopathy, no carotid bruit, no JVD, supple, symmetrical, trachea midline and thyroid not enlarged, symmetric, no tenderness/mass/nodules Lungs: clear to auscultation bilaterally Breasts: normal appearance, no masses or tenderness Heart: regular rate and rhythm, S1, S2 normal, no murmur, click, rub or gallop Abdomen: soft, non-tender; bowel sounds normal; no masses,  no organomegaly Pelvic: cervix normal in appearance, external genitalia normal, no adnexal masses or tenderness, no cervical motion tenderness, rectovaginal septum normal, uterus normal size, shape, and consistency and vagina normal without discharge    Assessment:    Healthy female exam. Lactational amenorrhea; needs flu vaccine     Plan:  Declined flu vaccine To  continue PNV and Vit D while breast feeding RTC 1 year or as needed.  Lisa Milian Aura CampsShambley, CNM   See After Visit Summary for Counseling Recommendations

## 2016-04-27 NOTE — Patient Instructions (Signed)
 Preventive Care 18-39 Years, Female Preventive care refers to lifestyle choices and visits with your health care provider that can promote health and wellness. What does preventive care include?  A yearly physical exam. This is also called an annual well check.  Dental exams once or twice a year.  Routine eye exams. Ask your health care provider how often you should have your eyes checked.  Personal lifestyle choices, including:  Daily care of your teeth and gums.  Regular physical activity.  Eating a healthy diet.  Avoiding tobacco and drug use.  Limiting alcohol use.  Practicing safe sex.  Taking vitamin and mineral supplements as recommended by your health care provider. What happens during an annual well check? The services and screenings done by your health care provider during your annual well check will depend on your age, overall health, lifestyle risk factors, and family history of disease. Counseling  Your health care provider may ask you questions about your:  Alcohol use.  Tobacco use.  Drug use.  Emotional well-being.  Home and relationship well-being.  Sexual activity.  Eating habits.  Work and work environment.  Method of birth control.  Menstrual cycle.  Pregnancy history. Screening  You may have the following tests or measurements:  Height, weight, and BMI.  Diabetes screening. This is done by checking your blood sugar (glucose) after you have not eaten for a while (fasting).  Blood pressure.  Lipid and cholesterol levels. These may be checked every 5 years starting at age 20.  Skin check.  Hepatitis C blood test.  Hepatitis B blood test.  Sexually transmitted disease (STD) testing.  BRCA-related cancer screening. This may be done if you have a family history of breast, ovarian, tubal, or peritoneal cancers.  Pelvic exam and Pap test. This may be done every 3 years starting at age 21. Starting at age 30, this may be done  every 5 years if you have a Pap test in combination with an HPV test. Discuss your test results, treatment options, and if necessary, the need for more tests with your health care provider. Vaccines  Your health care provider may recommend certain vaccines, such as:  Influenza vaccine. This is recommended every year.  Tetanus, diphtheria, and acellular pertussis (Tdap, Td) vaccine. You may need a Td booster every 10 years.  Varicella vaccine. You may need this if you have not been vaccinated.  HPV vaccine. If you are 26 or younger, you may need three doses over 6 months.  Measles, mumps, and rubella (MMR) vaccine. You may need at least one dose of MMR. You may also need a second dose.  Pneumococcal 13-valent conjugate (PCV13) vaccine. You may need this if you have certain conditions and were not previously vaccinated.  Pneumococcal polysaccharide (PPSV23) vaccine. You may need one or two doses if you smoke cigarettes or if you have certain conditions.  Meningococcal vaccine. One dose is recommended if you are age 19-21 years and a first-year college student living in a residence hall, or if you have one of several medical conditions. You may also need additional booster doses.  Hepatitis A vaccine. You may need this if you have certain conditions or if you travel or work in places where you may be exposed to hepatitis A.  Hepatitis B vaccine. You may need this if you have certain conditions or if you travel or work in places where you may be exposed to hepatitis B.  Haemophilus influenzae type b (Hib) vaccine. You may need   this if you have certain risk factors. Talk to your health care provider about which screenings and vaccines you need and how often you need them. This information is not intended to replace advice given to you by your health care provider. Make sure you discuss any questions you have with your health care provider. Document Released: 05/30/2001 Document Revised:  12/22/2015 Document Reviewed: 02/02/2015 Elsevier Interactive Patient Education  2017 Elsevier Inc.  

## 2016-04-28 LAB — CYTOLOGY - PAP

## 2016-07-25 ENCOUNTER — Encounter: Payer: Self-pay | Admitting: Obstetrics and Gynecology

## 2017-05-02 ENCOUNTER — Encounter: Payer: Medicaid Other | Admitting: Obstetrics and Gynecology

## 2017-08-06 ENCOUNTER — Emergency Department
Admission: EM | Admit: 2017-08-06 | Discharge: 2017-08-06 | Disposition: A | Discharge status: Home / Self Care | Payer: Self-pay | Attending: Student in an Organized Health Care Education/Training Program | Admitting: Student in an Organized Health Care Education/Training Program

## 2017-08-06 ENCOUNTER — Emergency Department: Payer: Self-pay

## 2017-08-06 ENCOUNTER — Encounter: Payer: Self-pay | Admitting: Emergency Medicine

## 2017-08-06 ENCOUNTER — Other Ambulatory Visit: Payer: Self-pay

## 2017-08-06 DIAGNOSIS — N3001 Acute cystitis with hematuria: Secondary | ICD-10-CM | POA: Insufficient documentation

## 2017-08-06 DIAGNOSIS — R109 Unspecified abdominal pain: Secondary | ICD-10-CM

## 2017-08-06 DIAGNOSIS — R1084 Generalized abdominal pain: Secondary | ICD-10-CM | POA: Insufficient documentation

## 2017-08-06 LAB — CBC WITH DIFFERENTIAL/PLATELET
BASOS ABS: 0 10*3/uL (ref 0–0.1)
BASOS PCT: 0 %
EOS ABS: 0.1 10*3/uL (ref 0–0.7)
Eosinophils Relative: 1 %
HCT: 37.1 % (ref 35.0–47.0)
Hemoglobin: 12.7 g/dL (ref 12.0–16.0)
Lymphocytes Relative: 17 %
Lymphs Abs: 1.5 10*3/uL (ref 1.0–3.6)
MCH: 29.8 pg (ref 26.0–34.0)
MCHC: 34.4 g/dL (ref 32.0–36.0)
MCV: 86.8 fL (ref 80.0–100.0)
MONO ABS: 0.7 10*3/uL (ref 0.2–0.9)
Monocytes Relative: 8 %
Neutro Abs: 6.9 10*3/uL — ABNORMAL HIGH (ref 1.4–6.5)
Neutrophils Relative %: 74 %
PLATELETS: 249 10*3/uL (ref 150–440)
RBC: 4.27 MIL/uL (ref 3.80–5.20)
RDW: 12.6 % (ref 11.5–14.5)
WBC: 9.3 10*3/uL (ref 3.6–11.0)

## 2017-08-06 LAB — COMPREHENSIVE METABOLIC PANEL
ALBUMIN: 4.1 g/dL (ref 3.5–5.0)
ALT: 12 U/L — ABNORMAL LOW (ref 14–54)
AST: 17 U/L (ref 15–41)
Alkaline Phosphatase: 47 U/L (ref 38–126)
Anion gap: 7 (ref 5–15)
BUN: 8 mg/dL (ref 6–20)
CO2: 24 mmol/L (ref 22–32)
Calcium: 8.8 mg/dL — ABNORMAL LOW (ref 8.9–10.3)
Chloride: 107 mmol/L (ref 101–111)
Creatinine, Ser: 0.48 mg/dL (ref 0.44–1.00)
GFR calc Af Amer: >60 mL/min (ref >60)
Glucose, Bld: 83 mg/dL (ref 65–99)
Potassium: 4 mmol/L (ref 3.5–5.1)
Sodium: 138 mmol/L (ref 135–145)
Total Bilirubin: 0.3 mg/dL (ref 0.3–1.2)
Total Protein: 7.5 g/dL (ref 6.5–8.1)

## 2017-08-06 LAB — URINALYSIS, COMPLETE (UACMP) WITH MICROSCOPIC
BILIRUBIN URINE: NEGATIVE
Glucose, UA: NEGATIVE mg/dL
KETONES UR: NEGATIVE mg/dL
NITRITE: POSITIVE — AB
PROTEIN: NEGATIVE mg/dL
Specific Gravity, Urine: 1.004 — ABNORMAL LOW (ref 1.005–1.030)
pH: 6 (ref 5.0–8.0)

## 2017-08-06 LAB — POCT PREGNANCY, URINE: PREG TEST UR: NEGATIVE

## 2017-08-06 MED ORDER — HYDROCODONE-ACETAMINOPHEN 5-325 MG PO TABS
1.0000 | ORAL_TABLET | Freq: Once | ORAL | Status: AC
  Administered 2017-08-06: 1 via ORAL
  Filled 2017-08-06: qty 1

## 2017-08-06 MED ORDER — CEPHALEXIN 500 MG PO CAPS: 500.0000 mg | ORAL_CAPSULE | Freq: Three times a day (TID) | ORAL | 0 refills | Status: AC

## 2017-08-06 MED ORDER — KETOROLAC TROMETHAMINE 30 MG/ML IJ SOLN
15.0000 mg | Freq: Once | INTRAMUSCULAR | Status: AC
  Administered 2017-08-06: 15 mg via INTRAVENOUS
  Filled 2017-08-06: qty 1

## 2017-08-06 MED ORDER — PROMETHAZINE HCL 12.5 MG PO TABS: 12.5000 mg | ORAL_TABLET | Freq: Four times a day (QID) | ORAL | 0 refills | Status: DC | PRN

## 2017-08-06 MED ORDER — CEPHALEXIN 500 MG PO CAPS
500.0000 mg | ORAL_CAPSULE | Freq: Once | ORAL | Status: AC
  Administered 2017-08-06: 500 mg via ORAL
  Filled 2017-08-06: qty 1

## 2017-08-06 MED ORDER — KETOROLAC TROMETHAMINE 30 MG/ML IJ SOLN
INTRAMUSCULAR | Status: AC
  Filled 2017-08-06: qty 1

## 2017-08-06 NOTE — ED Provider Notes (Signed)
[facility] Emergency Department Provider Note    First MD Initiated Contact with Patient 08/06/17 1316     (approximate)  I have reviewed the triage vital signs and the nursing notes.   HISTORY  Chief Complaint Flank Pain    HPI Natasha Lowery is a 27 y.o. female presents to the ER with chief complaint of right flank pain.  States that she has a history of kidney stones and this feels similar.  States that she thinks that she felt it pass in her bladder but since then is still having hematuria and discomfort when urinating.  Denies any fevers states that her pain is controlled at this point but because she still having difficulty urinating was worried she is no need to help having the stone pass. has never seen a urologist.  Past Medical History:  Diagnosis Date  . Heavy periods   . Irregular periods    Family History  Problem Relation Age of Onset  . Colon cancer Maternal Grandmother        grt mat gm  . Lupus Maternal Grandmother        deceased due to Lupus  . Diabetes Maternal Grandfather   . Diabetes Paternal Grandmother   . Heart disease Paternal Grandmother   . Lupus Mother        in remission  . Breast cancer Neg Hx   . Ovarian cancer Neg Hx    Past Surgical History:  Procedure Laterality Date  . none     Patient Active Problem List   Diagnosis Date Noted  . 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 Authorizing Provider  cephALEXin (KEFLEX) 500 MG capsule Take 1 capsule (500 mg total) by mouth 3 (three) times daily for 7 days. 08/06/17 08/13/17  Willy Eddy, MD  Cholecalciferol (VITAMIN D3) 5000 units CAPS Take 1 capsule (5,000 Units total) by mouth daily. 12/17/15   Shambley, Melody N, CNM  etonogestrel (NEXPLANON) 68 MG IMPL implant 1 each by Subdermal route once.    [provider]  Prenat-FeFum-FePo-FA-Omega 3 (CONCEPT DHA)  53.5-38-1 MG CAPS Take 1 tablet by mouth daily. 05/13/15   Shambley, Melody N, CNM  promethazine (PHENERGAN) 12.5 MG tablet Take 1 tablet (12.5 mg total) by mouth every 6 (six) hours as needed for nausea or vomiting. 08/06/17   Willy Eddy, MD    Allergies Patient has no known allergies.    Social History Social History   Tobacco Use  . Smoking status: Never Smoker  . Smokeless tobacco: Never Used  Substance Use Topics  . Alcohol use: Yes    Comment: rare  . Drug use: No    Review of Systems Patient denies headaches, rhinorrhea, blurry vision, numbness, shortness of breath, chest pain, edema, cough, abdominal pain, nausea, vomiting, diarrhea, dysuria, fevers, rashes or hallucinations unless otherwise stated above in HPI. ____________________________________________   PHYSICAL EXAM:  VITAL SIGNS: Vitals:   08/06/17 1154 08/06/17 1456  BP: 128/76 (!) 128/91  Pulse: 86 93  Resp: 14 20  Temp: 99.1 F (37.3 C)   SpO2: 100% 98%    Constitutional: Alert and oriented. Well appearing and in no acute distress. Eyes: Conjunctivae are normal.  Head: Atraumatic. Nose: No congestion/rhinnorhea. Mouth/Throat: Mucous membranes are moist.   Neck: No stridor. Painless ROM.  Cardiovascular: Normal rate, regular rhythm. Grossly normal heart sounds.  Good peripheral circulation. Respiratory: Normal respiratory effort.  No  retractions. Lungs CTAB. Gastrointestinal: Soft and nontender. No distention. No abdominal bruits. No CVA tenderness. Genitourinary:  Musculoskeletal: No lower extremity tenderness nor edema.  No joint effusions. Neurologic:  Normal speech and language. No gross focal neurologic deficits are appreciated. No facial droop Skin:  Skin is warm, dry and intact. No rash noted. Psychiatric: Mood and affect are normal. Speech and behavior are normal.  ____________________________________________   LABS (all labs ordered are listed, but only abnormal results are  displayed)  Results for orders placed or performed during the hospital encounter of 08/06/17 (from the past 24 hour(s))  Urinalysis, Complete w Microscopic     Status: Abnormal   Collection Time: 08/06/17 11:58 AM  Result Value Ref Range   Color, Urine AMBER (A) YELLOW   APPearance HAZY (A) CLEAR   Specific Gravity, Urine 1.004 (L) 1.005 - 1.030   pH 6.0 5.0 - 8.0   Glucose, UA NEGATIVE NEGATIVE mg/dL   Hgb urine dipstick MODERATE (A) NEGATIVE   Bilirubin Urine NEGATIVE NEGATIVE   Ketones, ur NEGATIVE NEGATIVE mg/dL   Protein, ur NEGATIVE NEGATIVE mg/dL   Nitrite POSITIVE (A) NEGATIVE   Leukocytes, UA LARGE (A) NEGATIVE   RBC / HPF 6-30 0 - 5 RBC/hpf   WBC, UA TOO NUMEROUS TO COUNT 0 - 5 WBC/hpf   Bacteria, UA FEW (A) NONE SEEN   WBC Clumps PRESENT   Pregnancy, urine POC     Status: None   Collection Time: 08/06/17 12:16 PM  Result Value Ref Range   Preg Test, Ur NEGATIVE NEGATIVE  CBC with Differential/Platelet     Status: Abnormal   Collection Time: 08/06/17  1:56 PM  Result Value Ref Range   WBC 9.3 3.6 - 11.0 K/uL   RBC 4.27 3.80 - 5.20 MIL/uL   Hemoglobin 12.7 12.0 - 16.0 g/dL   HCT 16.137.1 09.635.0 - 04.547.0 %   MCV 86.8 80.0 - 100.0 fL   MCH 29.8 26.0 - 34.0 pg   MCHC 34.4 32.0 - 36.0 g/dL   RDW 40.912.6 81.111.5 - 91.414.5 %   Platelets 249 150 - 440 K/uL   Neutrophils Relative % 74 %   Neutro Abs 6.9 (H) 1.4 - 6.5 K/uL   Lymphocytes Relative 17 %   Lymphs Abs 1.5 1.0 - 3.6 K/uL   Monocytes Relative 8 %   Monocytes Absolute 0.7 0.2 - 0.9 K/uL   Eosinophils Relative 1 %   Eosinophils Absolute 0.1 0 - 0.7 K/uL   Basophils Relative 0 %   Basophils Absolute 0.0 0 - 0.1 K/uL  Comprehensive metabolic panel     Status: Abnormal   Collection Time: 08/06/17  1:56 PM  Result Value Ref Range   Sodium 138 135 - 145 mmol/L   Potassium 4.0 3.5 - 5.1 mmol/L   Chloride 107 101 - 111 mmol/L   CO2 24 22 - 32 mmol/L   Glucose, Bld 83 65 - 99 mg/dL   BUN 8 6 - 20 mg/dL   Creatinine, Ser 7.820.48  0.44 - 1.00 mg/dL   Calcium 8.8 (L) 8.9 - 10.3 mg/dL   Total Protein 7.5 6.5 - 8.1 g/dL   Albumin 4.1 3.5 - 5.0 g/dL   AST 17 15 - 41 U/L   ALT 12 (L) 14 - 54 U/L   Alkaline Phosphatase 47 38 - 126 U/L   Total Bilirubin 0.3 0.3 - 1.2 mg/dL   GFR calc non Af Amer >60 >60 mL/min   GFR calc Af Amer >60 >60  mL/min   Anion gap 7 5 - 15   ____________________________________________  ____________________________________________  RADIOLOGY  I personally reviewed all radiographic images ordered to evaluate for the above acute complaints and reviewed radiology reports and findings.  These findings were personally discussed with the patient.  Please see medical record for radiology report.  ____________________________________________   PROCEDURES  Procedure(s) performed:  Procedures    Critical Care performed: no ____________________________________________   INITIAL IMPRESSION / ASSESSMENT AND PLAN / ED COURSE  Pertinent labs & imaging results that were available during my care of the patient were reviewed by me and considered in my medical decision making (see chart for details).  DDX: stone, pyelo, cystitis, appy  Natasha Lowery is a 27 y.o. who presents to the ED with p/w right flank pain. No fevers, no systemic symptoms. + urinary symptoms. Denies trauma or injury. Afebrile in ED. Exam as above. Flank TTP, otherwise abdominal exam is benign. No peritoneal signs. Possible kidney stone, cystitis, or pyelonephritis. UPT -. UA with evidence of pyuria . CT Stone with no obstructing renal calculi. Clinical picture is not consistent with appendicitis, diverticulitis, pancreatitis, cholecystitis, bowel perforation, aortic dissection, splenic injury or acute abdominal process at this time.  Pain improved, tolerating PO. Repeat ABD exam benign, will plan supportive treatment, ABX and early follow up for recheck.       As part of my medical decision making, I reviewed the  following data within the electronic MEDICAL RECORD NUMBER Nursing notes reviewed and incorporated, Labs reviewed, notes from prior ED visits.   ____________________________________________   FINAL CLINICAL IMPRESSION(S) / ED DIAGNOSES  Final diagnoses:  Right flank pain  Acute cystitis with hematuria      NEW MEDICATIONS STARTED DURING THIS VISIT:  Discharge Medication List as of 08/06/2017  2:36 PM    START taking these medications   Details  cephALEXin (KEFLEX) 500 MG capsule Take 1 capsule (500 mg total) by mouth 3 (three) times daily for 7 days., Starting Mon 08/06/2017, Until Mon 08/13/2017, Print         Note:  This document was prepared using Dragon voice recognition software and may include unintentional dictation errors.    Willy Eddy, MD 08/06/17 2245

## 2017-08-06 NOTE — ED Triage Notes (Signed)
Says she has had kidney stone for months.  Felt it drop down to bladder couple days ago and now feels like rocks in her bladder.

## 2017-12-29 IMAGING — US US THYROID
1 series · 14 of 25 positions shown · non-contrast
Comparison: None.

CLINICAL DATA: Enlarged, palpable on physical exam.  Pregnant.

EXAM:
THYROID ULTRASOUND
TECHNIQUE: Ultrasound examination of the thyroid gland and adjacent soft
tissues was performed.

[Series 1: us thyroid · 0.07mm/px · 14 of 56 slices shown]
[im 1/56]
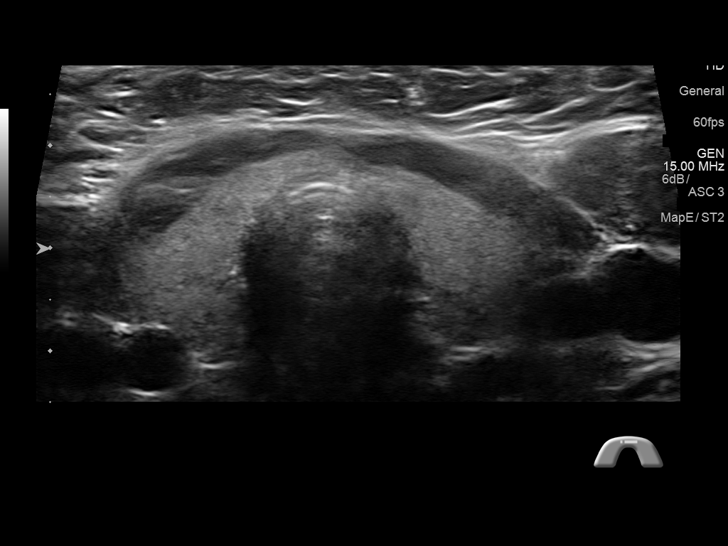
[im 5/56]
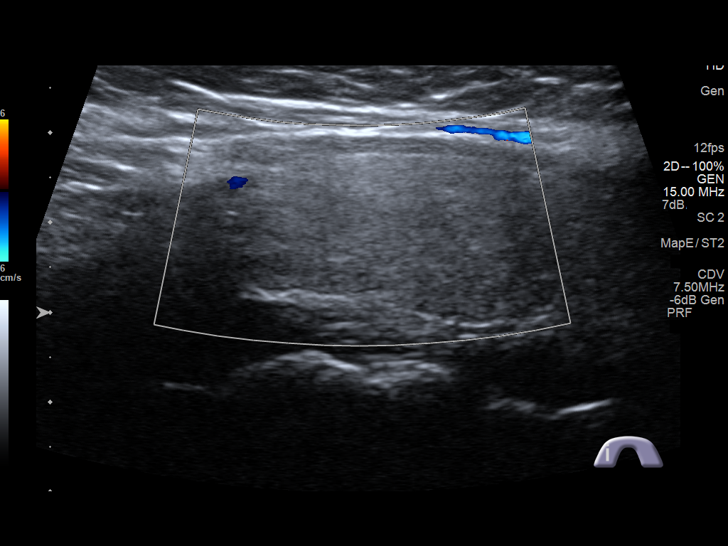
[im 10/56]
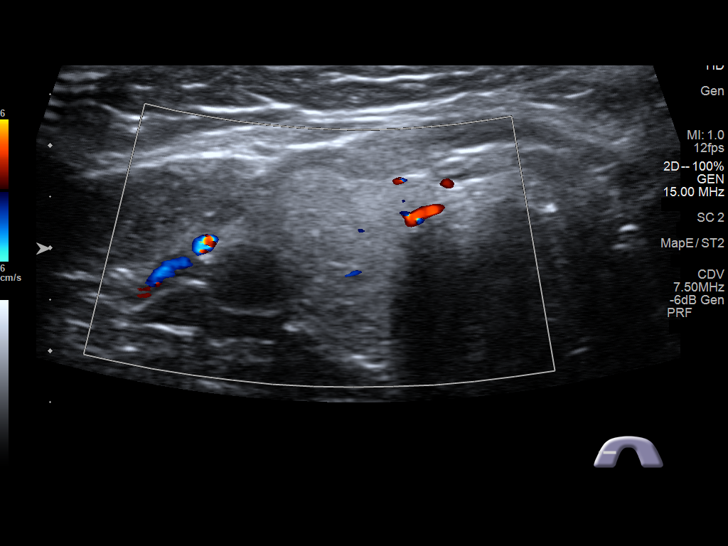
[im 14/56]
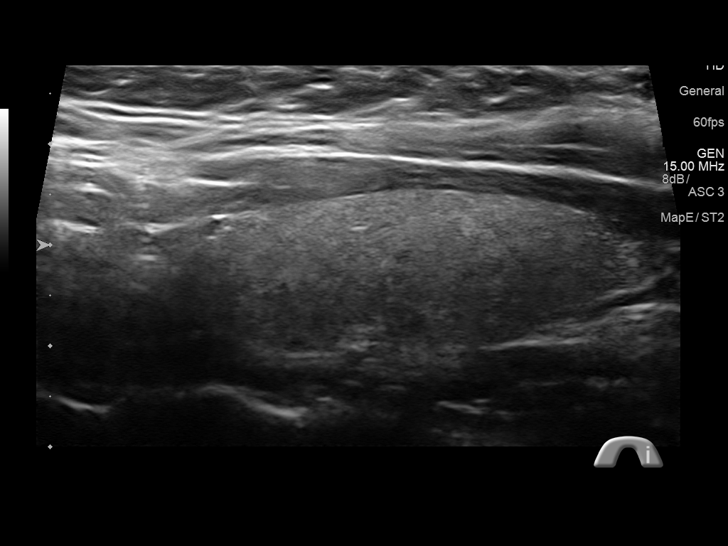
[im 19/56]
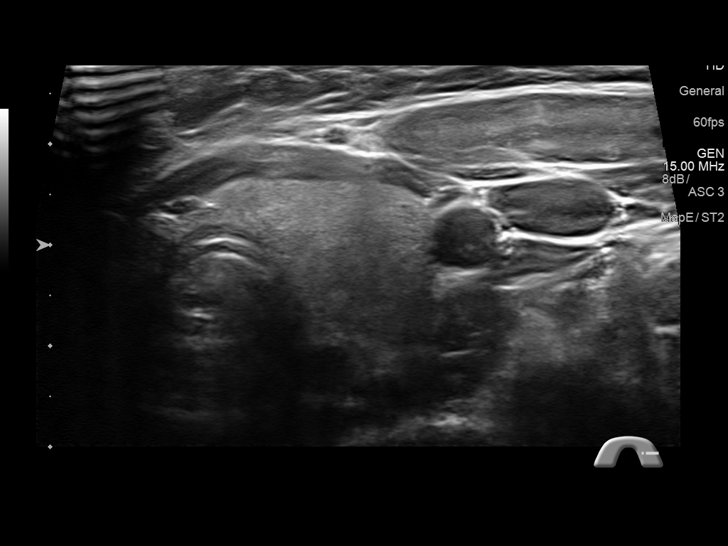
[im 21/56]
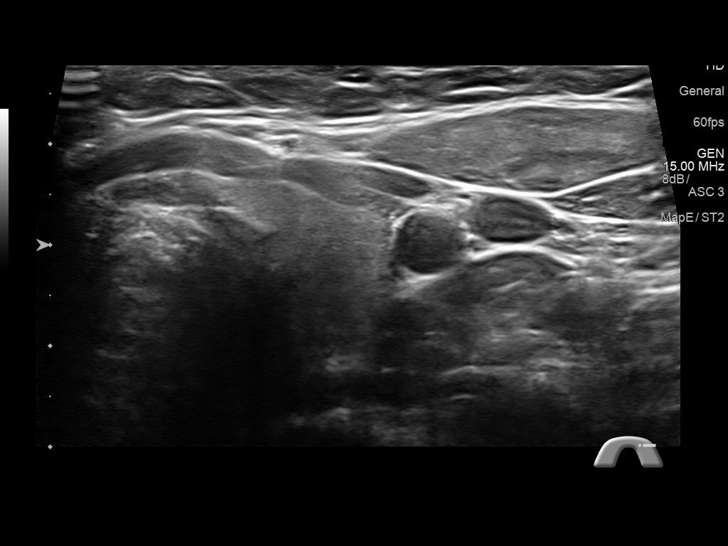
[im 26/56]
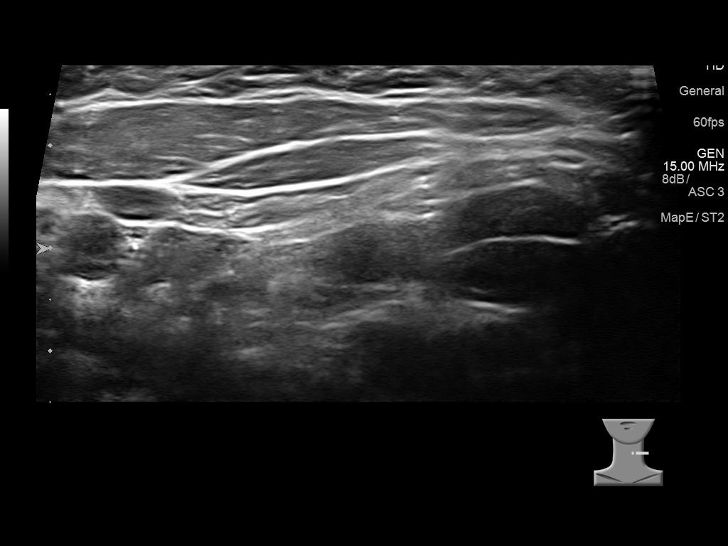
[im 30/56]
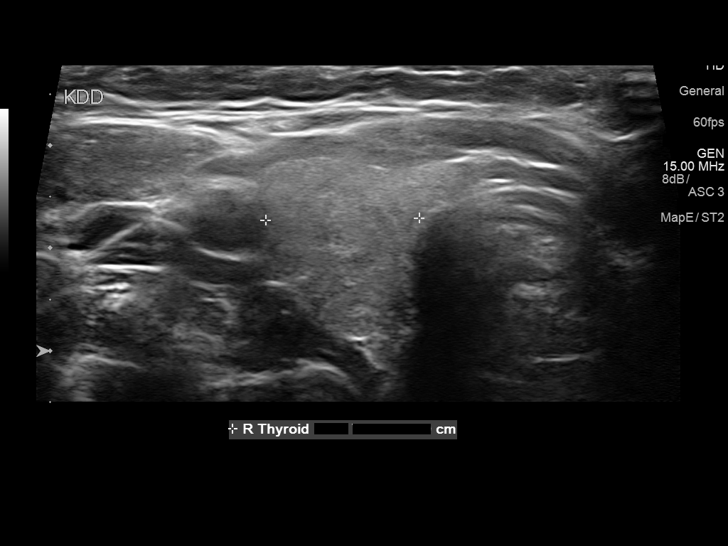
[im 35/56]
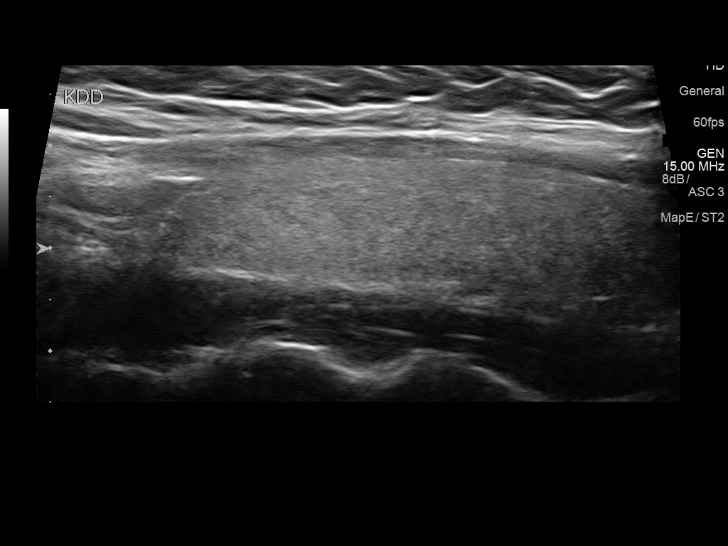
[im 37/56]
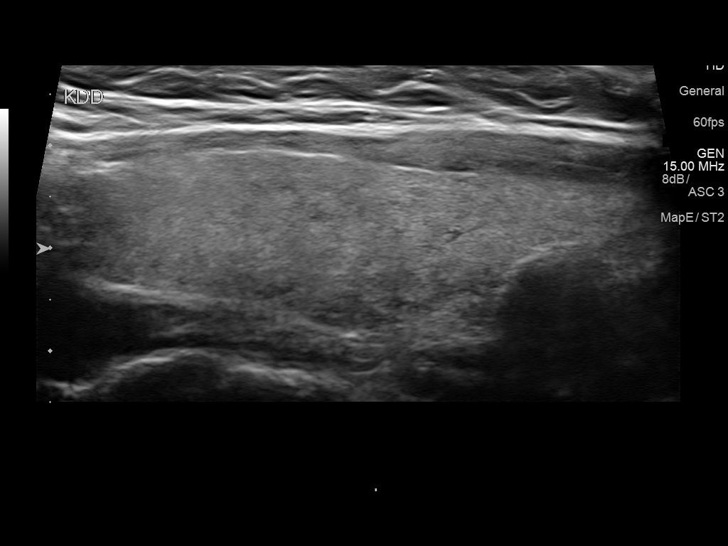
[im 42/56]
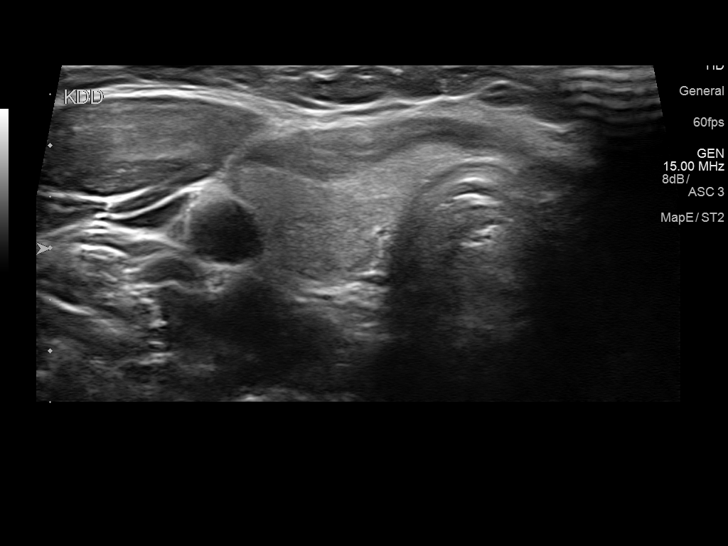
[im 46/56]
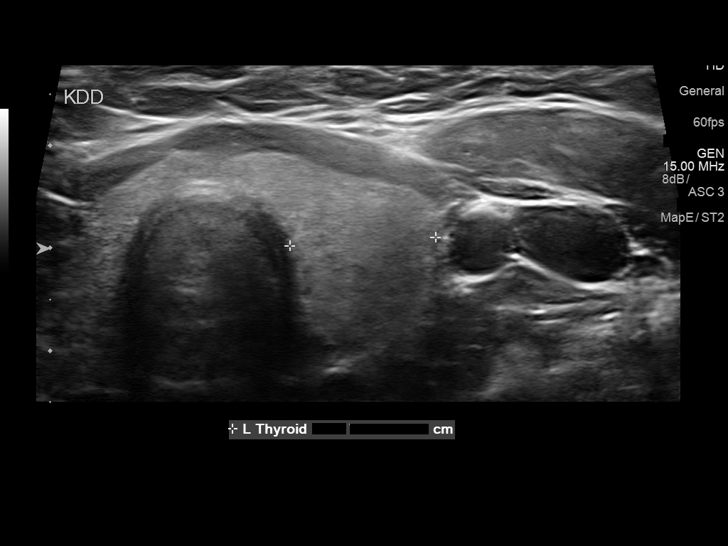
[im 51/56]
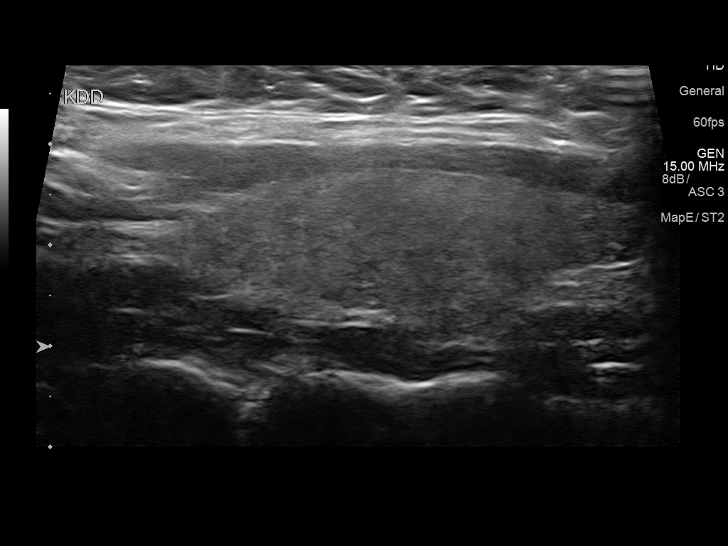
[im 56/56]
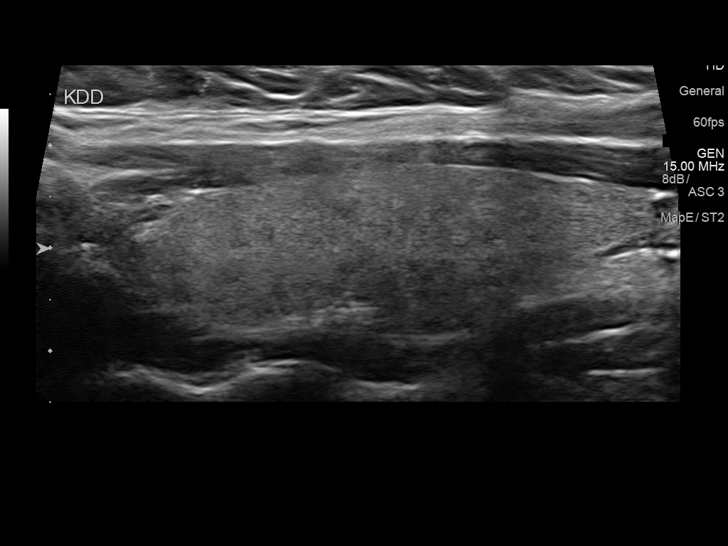

[14 of 25 positions shown; findings below may reference images not displayed]

FINDINGS: Right thyroid lobe

Measurements: 58 x 15 x 15 mm. Homogeneous echotexture without focal
lesion or hyperemia.

Left thyroid lobe

Measurements: 53 x 16 x 14 mm.  No nodules visualized.

Isthmus

Thickness: 3.5 mm.  No nodules visualized.

Lymphadenopathy

None visualized.
IMPRESSION: 1. Borderline thyromegaly without focal lesion or hyperemia.

## 2019-09-25 ENCOUNTER — Other Ambulatory Visit: Payer: Self-pay

## 2019-09-25 ENCOUNTER — Emergency Department (HOSPITAL_COMMUNITY)
Admission: EM | Admit: 2019-09-25 | Discharge: 2019-09-25 | Disposition: A | Discharge status: Home / Self Care | Payer: Medicaid Other | Attending: Emergency Medicine | Admitting: Emergency Medicine

## 2019-09-25 ENCOUNTER — Encounter (HOSPITAL_COMMUNITY): Payer: Self-pay | Admitting: *Deleted

## 2019-09-25 DIAGNOSIS — R112 Nausea with vomiting, unspecified: Secondary | ICD-10-CM | POA: Insufficient documentation

## 2019-09-25 DIAGNOSIS — R197 Diarrhea, unspecified: Secondary | ICD-10-CM | POA: Insufficient documentation

## 2019-09-25 DIAGNOSIS — R1084 Generalized abdominal pain: Secondary | ICD-10-CM | POA: Insufficient documentation

## 2019-09-25 LAB — COMPREHENSIVE METABOLIC PANEL
ALT: 19 U/L (ref 0–44)
AST: 16 U/L (ref 15–41)
Albumin: 4.1 g/dL (ref 3.5–5.0)
Alkaline Phosphatase: 52 U/L (ref 38–126)
Anion gap: 13 (ref 5–15)
BUN: 6 mg/dL (ref 6–20)
CO2: 17 mmol/L — ABNORMAL LOW (ref 22–32)
Calcium: 8.7 mg/dL — ABNORMAL LOW (ref 8.9–10.3)
Chloride: 104 mmol/L (ref 98–111)
Creatinine, Ser: 0.67 mg/dL (ref 0.44–1.00)
GFR calc Af Amer: >60 mL/min (ref >60)
GFR calc non Af Amer: >60 mL/min (ref >60)
Glucose, Bld: 111 mg/dL — ABNORMAL HIGH (ref 70–99)
Potassium: 3.1 mmol/L — ABNORMAL LOW (ref 3.5–5.1)
Sodium: 134 mmol/L — ABNORMAL LOW (ref 135–145)
Total Bilirubin: 0.8 mg/dL (ref 0.3–1.2)
Total Protein: 7.7 g/dL (ref 6.5–8.1)

## 2019-09-25 LAB — CBC WITH DIFFERENTIAL/PLATELET
Abs Immature Granulocytes: 0.02 10*3/uL (ref 0.00–0.07)
Basophils Absolute: 0 10*3/uL (ref 0.0–0.1)
Basophils Relative: 0 %
Eosinophils Absolute: 0 10*3/uL (ref 0.0–0.5)
Eosinophils Relative: 0 %
HCT: 42.7 % (ref 36.0–46.0)
Hemoglobin: 14.5 g/dL (ref 12.0–15.0)
Immature Granulocytes: 0 %
Lymphocytes Relative: 8 %
Lymphs Abs: 0.8 10*3/uL (ref 0.7–4.0)
MCH: 28.5 pg (ref 26.0–34.0)
MCHC: 34 g/dL (ref 30.0–36.0)
MCV: 84.1 fL (ref 80.0–100.0)
Monocytes Absolute: 0.5 10*3/uL (ref 0.1–1.0)
Monocytes Relative: 5 %
Neutro Abs: 8.8 10*3/uL — ABNORMAL HIGH (ref 1.7–7.7)
Neutrophils Relative %: 87 %
Platelets: 212 10*3/uL (ref 150–400)
RBC: 5.08 MIL/uL (ref 3.87–5.11)
RDW: 13.2 % (ref 11.5–15.5)
WBC: 10.1 10*3/uL (ref 4.0–10.5)
nRBC: 0 % (ref 0.0–0.2)

## 2019-09-25 LAB — LIPASE, BLOOD: Lipase: 29 U/L (ref 11–51)

## 2019-09-25 LAB — URINALYSIS, ROUTINE W REFLEX MICROSCOPIC
Bilirubin Urine: NEGATIVE
Glucose, UA: NEGATIVE mg/dL
Ketones, ur: 80 mg/dL — AB
Nitrite: NEGATIVE
Protein, ur: 100 mg/dL — AB
Specific Gravity, Urine: 1.024 (ref 1.005–1.030)
pH: 6 (ref 5.0–8.0)

## 2019-09-25 LAB — PREGNANCY, URINE: Preg Test, Ur: NEGATIVE

## 2019-09-25 MED ORDER — HYDROCODONE-ACETAMINOPHEN 5-325 MG PO TABS: 1.0000 | ORAL_TABLET | ORAL | 0 refills | Status: DC | PRN

## 2019-09-25 MED ORDER — ONDANSETRON HCL 4 MG/2ML IJ SOLN
4.0000 mg | Freq: Once | INTRAMUSCULAR | Status: AC
  Administered 2019-09-25: 4 mg via INTRAVENOUS
  Filled 2019-09-25: qty 2

## 2019-09-25 MED ORDER — SODIUM CHLORIDE 0.9 % IV BOLUS
1000.0000 mL | Freq: Once | INTRAVENOUS | Status: AC
  Administered 2019-09-25: 1000 mL via INTRAVENOUS

## 2019-09-25 MED ORDER — ONDANSETRON 4 MG PO TBDP: 4.0000 mg | ORAL_TABLET | Freq: Three times a day (TID) | ORAL | 0 refills | Status: DC | PRN

## 2019-09-25 MED ORDER — MORPHINE SULFATE (PF) 2 MG/ML IV SOLN
2.0000 mg | Freq: Once | INTRAVENOUS | Status: AC
  Administered 2019-09-25: 2 mg via INTRAVENOUS
  Filled 2019-09-25: qty 1

## 2019-09-25 MED ORDER — AZITHROMYCIN 500 MG PO TABS: 500.0000 mg | ORAL_TABLET | Freq: Every day | ORAL | 0 refills | Status: DC

## 2019-09-25 MED ORDER — POTASSIUM CHLORIDE 20 MEQ PO PACK
40.0000 meq | PACK | Freq: Once | ORAL | Status: AC
  Administered 2019-09-25: 40 meq via ORAL
  Filled 2019-09-25: qty 2

## 2019-09-25 MED ORDER — DICYCLOMINE HCL 10 MG PO CAPS
10.0000 mg | ORAL_CAPSULE | Freq: Once | ORAL | Status: AC
  Administered 2019-09-25: 10 mg via ORAL
  Filled 2019-09-25: qty 1

## 2019-09-25 NOTE — Discharge Instructions (Signed)
Take Zofran as needed for nausea vomiting Start Azithromycin once daily for 3 days for possible bacterial infection Take norco as needed for severe pain. This medicine makes you drowsy so avoid driving or drinking any alcohol Follow a bland diet - clear liquids, bananas, rice, apples, toast/crackers are fine Return if you are worsening

## 2019-09-25 NOTE — ED Provider Notes (Signed)
[facility] EMERGENCY DEPARTMENT Provider Note   CSN: [id number] Arrival date & time: 09/25/19  1016     History Chief Complaint  Patient presents with  . Abdominal Pain  . Emesis  . Diarrhea    Natasha Lowery is a 29 y.o. female who presents with abdominal pain, N/V/D. She states that it started with diarrhea yesterday. The diarrhea is brown, watery, and has a foul odor. Over the day yesterday she started vomiting and had "projectile vomiting" last night. She reports associated generalized abdominal pain that comes in waves before she has vomiting or diarrhea that feels like her abdomen is "seizing up" like contractions. It radiates throughout her abdomen to her back and chest. She reports a fever of 103 and gets hot and cold. She ate an egg that was cracked and is unsure if that's related. Otherwise she is not working right now and no one has been sick at home. No recent travel or antibiotic use. She denies URI symptoms, cough, chest pain, sob, urinary symptoms. She has had the J&J vaccine.  HPI     Past Medical History:  Diagnosis Date  . Heavy periods   . Irregular periods     Patient Active Problem List   Diagnosis Date Noted  . 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 none       OB History    Gravida  2   Para  1   Term  1   Preterm      AB      Living  1     SAB      TAB      Ectopic      Multiple      Live Births  1           Family History  Problem Relation Age of Onset  . Colon cancer Maternal Grandmother        grt mat gm  . Lupus Maternal Grandmother        deceased due to Lupus  . Diabetes Maternal Grandfather   . Diabetes Paternal Grandmother   . Heart disease Paternal Grandmother   . Lupus Mother        in remission  . Breast cancer Neg Hx   . Ovarian cancer Neg Hx     Social History   Tobacco Use  . Smoking status: Never Smoker  . Smokeless tobacco: Never  Used  Substance Use Topics  . Alcohol use: Yes    Comment: rare  . Drug use: No    Home Medications Prior to Admission medications   Medication Sig Start Date End Date Taking? Authorizing Provider  Cholecalciferol (VITAMIN D3) 5000 units CAPS Take 1 capsule (5,000 Units total) by mouth daily. 12/17/15   Shambley, Melody N, CNM  etonogestrel (NEXPLANON) 68 MG IMPL implant 1 each by Subdermal route once.    [provider]  Prenat-FeFum-FePo-FA-Omega 3 (CONCEPT DHA) 53.5-38-1 MG CAPS Take 1 tablet by mouth daily. 05/13/15   Shambley, Melody N, CNM  promethazine (PHENERGAN) 12.5 MG tablet Take 1 tablet (12.5 mg total) by mouth every 6 (six) hours as needed for nausea or vomiting. 08/06/17   Merlyn Lot, MD    Allergies    Patient has no known allergies.  Review of Systems   Review of Systems  Constitutional: Positive for activity change, appetite change, chills, fatigue and fever.  Respiratory: Negative for cough and shortness of breath.  Cardiovascular: Negative for chest pain.  Gastrointestinal: Positive for abdominal pain, diarrhea, nausea and vomiting. Negative for blood in stool.  Genitourinary: Negative for difficulty urinating, dysuria and flank pain.  All other systems reviewed and are negative.   Physical Exam Updated Vital Signs BP 126/87   Pulse (!) 112   Temp 99.8 F (37.7 C)   Resp (!) 24   Ht 5\' 6"  (1.676 m)   Wt 85.3 kg   SpO2 100%   BMI 30.34 kg/m   Physical Exam Vitals and nursing note reviewed.  Constitutional:      General: She is not in acute distress.    Appearance: She is well-developed. She is obese. She is not ill-appearing.     Comments: Fatigued appearing, NAD  HENT:     Head: Normocephalic and atraumatic.  Eyes:     General: No scleral icterus.       Right eye: No discharge.        Left eye: No discharge.     Conjunctiva/sclera: Conjunctivae normal.     Pupils: Pupils are equal, round, and reactive to light.  Cardiovascular:       Rate and Rhythm: Normal rate and regular rhythm.  Pulmonary:     Effort: Pulmonary effort is normal. No respiratory distress.     Breath sounds: Normal breath sounds.  Abdominal:     General: Abdomen is protuberant. There is no distension.     Palpations: Abdomen is soft.     Tenderness: There is abdominal tenderness (mild, generalized).  Musculoskeletal:     Cervical back: Normal range of motion.  Skin:    General: Skin is warm and dry.  Neurological:     Mental Status: She is alert and oriented to person, place, and time.  Psychiatric:        Behavior: Behavior normal.     ED Results / Procedures / Treatments   Labs (all labs ordered are listed, but only abnormal results are displayed) Labs Reviewed  URINALYSIS, ROUTINE W REFLEX MICROSCOPIC - Abnormal; Notable for the following components:      Result Value   Color, Urine AMBER (*)    APPearance CLOUDY (*)    Hgb urine dipstick SMALL (*)    Ketones, ur 80 (*)    Protein, ur 100 (*)    Leukocytes,Ua SMALL (*)    Bacteria, UA RARE (*)    All other components within normal limits  CBC WITH DIFFERENTIAL/PLATELET - Abnormal; Notable for the following components:   Neutro Abs 8.8 (*)    All other components within normal limits  COMPREHENSIVE METABOLIC PANEL - Abnormal; Notable for the following components:   Sodium 134 (*)    Potassium 3.1 (*)    CO2 17 (*)    Glucose, Bld 111 (*)    Calcium 8.7 (*)    All other components within normal limits  PREGNANCY, URINE  LIPASE, BLOOD    EKG EKG Interpretation  Date/Time:  Thursday September 25 2019 10:43:35 EDT Ventricular Rate:  113 PR Interval:    QRS Duration: 89 QT Interval:  301 QTC Calculation: 413 R Axis:   93 Text Interpretation: Sinus tachycardia Borderline right axis deviation Confirmed by [id number] [phone]) on 09/25/2019 2:26:29 PM   Radiology No results found.  Procedures Procedures (including critical care time)  Medications Ordered in  ED Medications  sodium chloride 0.9 % bolus 1,000 mL (0 mLs Intravenous Stopped 09/25/19 1500)  ondansetron (ZOFRAN) injection 4 mg (4 mg Intravenous Given  09/25/19 1135)  morphine 2 MG/ML injection 2 mg (2 mg Intravenous Given 09/25/19 1136)  sodium chloride 0.9 % bolus 1,000 mL (0 mLs Intravenous Stopped 09/25/19 1331)  dicyclomine (BENTYL) capsule 10 mg (10 mg Oral Given 09/25/19 1245)  potassium chloride (KLOR-CON) packet 40 mEq (40 mEq Oral Given 09/25/19 1333)  morphine 2 MG/ML injection 2 mg (2 mg Intravenous Given 09/25/19 1536)    ED Course  I have reviewed the triage vital signs and the nursing notes.  Pertinent labs & imaging results that were available during my care of the patient were reviewed by me and considered in my medical decision making (see chart for details).  29 year old female presents with nausea, vomiting, diarrhea and generalized abdominal pain.  She is tachycardic but otherwise vital signs are reassuring.  She states she was running a fever of 103 at home.  She states that she ate a cracked egg and may be some bad cream.  She denies any blood in the stool.  Will obtain labs, urine, and give fluids, Zofran, medicine for pain.  CBC is normal.  CMP is remarkable for mild hyponatremia and hypokalemia.  Bicarb is 17.  UA has over 80 ketones but no signs of infection.  1:32 PM Rechecked pt - she is feeling much better. She is still tachycardic. Will do another fluid bolus and potassium.  Pt is having a recurrance of pain.  Another dose of morphine was given.  Recheck patient again.  She states that pain is better however she is concerned about the pain. Pt given reassurance that she is having a diarrheal illness with clear N/V/D. We did discuss that symptoms could be bacterial vs viral due to high fever at home with multiple episodes of diarrhea here. She was advised that likely symptoms would resolve and imaging of her abdomen would most likely be more harmful than  beneficial. I have a very low suspicion for any surgical process in the abdomen. Will rx Zofran, small course of pain meds, and Azithromycin for suspected infectious diarrhea for 3 days. She was advised to follow a bland diet and return if worsening in the next 2-3 days.  MDM Rules/Calculators/A&P                           Final Clinical Impression(s) / ED Diagnoses Final diagnoses:  Nausea vomiting and diarrhea  Generalized abdominal pain    Rx / DC Orders ED Discharge Orders    None       Bethel Born, PA-C 09/25/19 Rosalin Hawking, MD 09/26/19 [phone]

## 2019-09-25 NOTE — ED Triage Notes (Signed)
Abdominal pain with vomiting and diarrhea onset yesterday

## 2019-09-26 ENCOUNTER — Other Ambulatory Visit: Payer: Self-pay

## 2019-09-26 ENCOUNTER — Encounter (HOSPITAL_COMMUNITY): Payer: Self-pay

## 2019-09-26 ENCOUNTER — Emergency Department (HOSPITAL_COMMUNITY)
Admission: EM | Admit: 2019-09-26 | Discharge: 2019-09-27 | Disposition: A | Discharge status: Home / Self Care | Payer: Self-pay | Attending: Emergency Medicine | Admitting: Emergency Medicine

## 2019-09-26 DIAGNOSIS — I880 Nonspecific mesenteric lymphadenitis: Secondary | ICD-10-CM

## 2019-09-26 DIAGNOSIS — R10819 Abdominal tenderness, unspecified site: Secondary | ICD-10-CM | POA: Insufficient documentation

## 2019-09-26 DIAGNOSIS — R112 Nausea with vomiting, unspecified: Secondary | ICD-10-CM

## 2019-09-26 DIAGNOSIS — R197 Diarrhea, unspecified: Secondary | ICD-10-CM | POA: Insufficient documentation

## 2019-09-26 LAB — COMPREHENSIVE METABOLIC PANEL
ALT: 16 U/L (ref 0–44)
AST: 18 U/L (ref 15–41)
Albumin: 4.1 g/dL (ref 3.5–5.0)
Alkaline Phosphatase: 43 U/L (ref 38–126)
Anion gap: 14 (ref 5–15)
BUN: <5 mg/dL — ABNORMAL LOW (ref 6–20)
CO2: 18 mmol/L — ABNORMAL LOW (ref 22–32)
Calcium: 8.8 mg/dL — ABNORMAL LOW (ref 8.9–10.3)
Chloride: 103 mmol/L (ref 98–111)
Creatinine, Ser: 0.58 mg/dL (ref 0.44–1.00)
GFR calc Af Amer: >60 mL/min (ref >60)
GFR calc non Af Amer: >60 mL/min (ref >60)
Glucose, Bld: 95 mg/dL (ref 70–99)
Potassium: 3.3 mmol/L — ABNORMAL LOW (ref 3.5–5.1)
Sodium: 135 mmol/L (ref 135–145)
Total Bilirubin: 0.6 mg/dL (ref 0.3–1.2)
Total Protein: 7.4 g/dL (ref 6.5–8.1)

## 2019-09-26 LAB — CBC
HCT: 40.5 % (ref 36.0–46.0)
Hemoglobin: 13.7 g/dL (ref 12.0–15.0)
MCH: 28.7 pg (ref 26.0–34.0)
MCHC: 33.8 g/dL (ref 30.0–36.0)
MCV: 84.7 fL (ref 80.0–100.0)
Platelets: 206 10*3/uL (ref 150–400)
RBC: 4.78 MIL/uL (ref 3.87–5.11)
RDW: 13.2 % (ref 11.5–15.5)
WBC: 4.7 10*3/uL (ref 4.0–10.5)
nRBC: 0 % (ref 0.0–0.2)

## 2019-09-26 LAB — LIPASE, BLOOD: Lipase: 43 U/L (ref 11–51)

## 2019-09-26 MED ORDER — SODIUM CHLORIDE 0.9 % IV BOLUS
1000.0000 mL | Freq: Once | INTRAVENOUS | Status: AC
  Administered 2019-09-26: 1000 mL via INTRAVENOUS

## 2019-09-26 MED ORDER — ONDANSETRON HCL 4 MG/2ML IJ SOLN
4.0000 mg | Freq: Once | INTRAMUSCULAR | Status: AC
  Administered 2019-09-26: 4 mg via INTRAVENOUS
  Filled 2019-09-26: qty 2

## 2019-09-26 NOTE — ED Triage Notes (Signed)
Pt here yesterday. States her diarrhea is worse. Unable to keep foods down.

## 2019-09-26 NOTE — ED Notes (Signed)
Pt here yesterday   Has been on antibiotics   Diarrhea since then   Today D x 20   Stool spec to lab - no order with

## 2019-09-26 NOTE — ED Provider Notes (Signed)
[facility] EMERGENCY DEPARTMENT Provider Note   CSN: [id number] Arrival date & time: 09/26/19  1841     History Chief Complaint  Patient presents with  . Diarrhea    Natasha Lowery is a 29 y.o. female.  29 year old female returns with continued abdominal pain, nausea, vomiting and diarrhea.  She was seen for the same yesterday and started on Zithromax for which she just had 1 dose.  States symptoms have been ongoing for the past 3 days.  Started as nausea with vomiting and generalized abdominal pain it comes and goes and feels like it is "seizing up".  She is not had any vomiting for the past 24 hours.  However her diarrhea has increased to approximately 20 episodes today that is green and brown but nonbloody.  Reports having a fever of 103 2 days ago but none since.  Has crampy lower abdominal pain that feels like contractions and feels like her belly is seizing up.  Has not had any vomiting for more than 24 hours.  She believes she could have eaten some bad eggs or bad cream several days ago.  Denies sick contacts or recent travel.  No antibiotic use other than the Zithromax she was received yesterday.  She still has appendix and gallbladder.   Diarrhea Associated symptoms: abdominal pain, fever and vomiting   Associated symptoms: no arthralgias, no headaches and no myalgias        Past Medical History:  Diagnosis Date  . Heavy periods   . Irregular periods     Patient Active Problem List   Diagnosis Date Noted  . 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 none       OB History    Gravida  2   Para  1   Term  1   Preterm      AB      Living  1     SAB      TAB      Ectopic      Multiple      Live Births  1           Family History  Problem Relation Age of Onset  . Colon cancer Maternal Grandmother        grt mat gm  . Lupus Maternal Grandmother        deceased due to Lupus  .  Diabetes Maternal Grandfather   . Diabetes Paternal Grandmother   . Heart disease Paternal Grandmother   . Lupus Mother        in remission  . Breast cancer Neg Hx   . Ovarian cancer Neg Hx     Social History   Tobacco Use  . Smoking status: Never Smoker  . Smokeless tobacco: Never Used  Substance Use Topics  . Alcohol use: Yes    Comment: rare  . Drug use: No    Home Medications Prior to Admission medications   Medication Sig Start Date End Date Taking? Authorizing Provider  azithromycin (ZITHROMAX) 500 MG tablet Take 1 tablet (500 mg total) by mouth daily. Take first 2 tablets together, then 1 every day until finished. 09/25/19  Yes Recardo Evangelist, PA-C  etonogestrel (NEXPLANON) 68 MG IMPL implant 1 each by Subdermal route once.   Yes [provider]  HYDROcodone-acetaminophen (NORCO/VICODIN) 5-325 MG tablet Take 1 tablet by mouth every 4 (four) hours as needed. 09/25/19  Yes Recardo Evangelist, PA-C  ondansetron (  ZOFRAN ODT) 4 MG disintegrating tablet Take 1 tablet (4 mg total) by mouth every 8 (eight) hours as needed for nausea or vomiting. 09/25/19  Yes Bethel Born, PA-C    Allergies    Patient has no known allergies.  Review of Systems   Review of Systems  Constitutional: Positive for activity change, appetite change, fatigue and fever.  HENT: Negative for congestion and rhinorrhea.   Respiratory: Negative for cough, chest tightness and shortness of breath.   Cardiovascular: Negative for chest pain.  Gastrointestinal: Positive for abdominal pain, diarrhea, nausea and vomiting.  Genitourinary: Negative for dysuria and hematuria.  Musculoskeletal: Negative for arthralgias and myalgias.  Skin: Negative for rash.  Neurological: Positive for weakness. Negative for dizziness and headaches.   all other systems are negative except as noted in the HPI and PMH.    Physical Exam Updated Vital Signs BP 119/80   Pulse 95   Temp 99.2 F (37.3 C) (Oral)    Resp (!) 22   Ht 5\' 6"  (1.676 m)   Wt 86.2 kg   SpO2 100%   BMI 30.67 kg/m   Physical Exam Vitals and nursing note reviewed.  Constitutional:      General: She is not in acute distress.    Appearance: She is well-developed.  HENT:     Head: Normocephalic and atraumatic.     Mouth/Throat:     Pharynx: No oropharyngeal exudate.  Eyes:     Conjunctiva/sclera: Conjunctivae normal.     Pupils: Pupils are equal, round, and reactive to light.  Neck:     Comments: No meningismus. Cardiovascular:     Rate and Rhythm: Normal rate and regular rhythm.     Heart sounds: Normal heart sounds. No murmur heard.   Pulmonary:     Effort: Pulmonary effort is normal. No respiratory distress.     Breath sounds: Normal breath sounds.  Abdominal:     Palpations: Abdomen is soft.     Tenderness: There is abdominal tenderness. There is no guarding or rebound.     Comments: Soft abdomen, periumbilical, right lower and left lower quadrant tenderness without guarding or rebound  Musculoskeletal:        General: No tenderness. Normal range of motion.     Cervical back: Normal range of motion and neck supple.     Comments: No CVA tenderness  Skin:    General: Skin is warm.  Neurological:     Mental Status: She is alert and oriented to person, place, and time.     Cranial Nerves: No cranial nerve deficit.     Motor: No abnormal muscle tone.     Coordination: Coordination normal.     Comments:  5/5 strength throughout. CN 2-12 intact.Equal grip strength.   Psychiatric:        Behavior: Behavior normal.     ED Results / Procedures / Treatments   Labs (all labs ordered are listed, but only abnormal results are displayed) Labs Reviewed  COMPREHENSIVE METABOLIC PANEL - Abnormal; Notable for the following components:      Result Value   Potassium 3.3 (*)    CO2 18 (*)    BUN <5 (*)    Calcium 8.8 (*)    All other components within normal limits  URINALYSIS, ROUTINE W REFLEX MICROSCOPIC -  Abnormal; Notable for the following components:   Ketones, ur 80 (*)    Protein, ur 30 (*)    All other components within normal limits  C  DIFFICILE QUICK SCREEN W PCR REFLEX  GASTROINTESTINAL PANEL BY PCR, STOOL (REPLACES STOOL CULTURE)  LIPASE, BLOOD  CBC  PREGNANCY, URINE    EKG None  Radiology CT ABDOMEN PELVIS W CONTRAST  Result Date: 09/27/2019 CLINICAL DATA:  Right lower quadrant pain, nausea, vomiting EXAM: CT ABDOMEN AND PELVIS WITH CONTRAST TECHNIQUE: Multidetector CT imaging of the abdomen and pelvis was performed using the standard protocol following bolus administration of intravenous contrast. CONTRAST:  OMNIPAQUE IOHEXOL 300 MG/ML  SOLN COMPARISON:  08/06/2017 FINDINGS: Lower chest: Lung bases are clear. No effusions. Heart is normal size. Hepatobiliary: Diffuse low-density throughout the liver compatible with fatty infiltration. No focal abnormality. Gallbladder unremarkable. Pancreas: No focal abnormality or ductal dilatation. Spleen: No focal abnormality.  Normal size. Adrenals/Urinary Tract: No adrenal abnormality. No focal renal abnormality. No stones or hydronephrosis. Urinary bladder is unremarkable. Stomach/Bowel: Normal appendix. Stomach, large and small bowel grossly unremarkable. Vascular/Lymphatic: No aneurysm. Shotty retroperitoneal and mesenteric lymph nodes. Mesenteric lymph nodes are most prominent in the right lower quadrant. Reproductive: Uterus and adnexa unremarkable.  No mass. Other: Small amount of free fluid in the pelvis.  No free air. Musculoskeletal: No acute bony abnormality. IMPRESSION: Prominent right lower quadrant mesenteric lymph nodes may reflect mesenteric adenitis. Normal appendix. Fatty infiltration of the liver. Electronically Signed   By: Charlett Nose M.D.   On: 09/27/2019 01:23    Procedures Procedures (including critical care time)  Medications Ordered in ED Medications  sodium chloride 0.9 % bolus 1,000 mL (has no administration  in time range)  ondansetron (ZOFRAN) injection 4 mg (has no administration in time range)    ED Course  I have reviewed the triage vital signs and the nursing notes.  Pertinent labs & imaging results that were available during my care of the patient were reviewed by me and considered in my medical decision making (see chart for details).    MDM Rules/Calculators/A&P                         Continued abdominal pain with nausea, vomiting and diarrhea for the past 3 days.  Abdomen soft without peritoneal signs.  Afebrile on arrival.  Patient hydrated and given symptom control.  Labs show continued metabolic acidosis.  Will obtain imaging given her return visit with ongoing pain and send stool studies.  Labs today show mild hypokalemia and metabolic acidosis.  She is hydrated.  Still has large ketones in the urine.  Heart rate has improved to the 80s.  CT scan today shows normal appendix but mesenteric adenitis in the right lower quadrant.  Stool studies are sent.  Labs with mild hypokalemia and metabolic acidosis.  Large ketones in urine.  Patient appears well on recheck.  She is tolerating p.o. without any further vomiting or diarrhea.  She wishes to go home.  CT scan results discussed with her.  Advised to continue her Zofran as well as azithromycin she received yesterday.  Advised her stool studies are pending and she will be called if the results are positive. CT scan today shows mesenteric adenitis without evidence of appendicitis.  Follow-up with your PCP.  Return precautions discussed including unable to eat, drink, intractable nausea or vomiting, fever worsening pain or other concerns. Final Clinical Impression(s) / ED Diagnoses Final diagnoses:  Nausea vomiting and diarrhea  Mesenteric adenitis    Rx / DC Orders ED Discharge Orders    None       Sybil Shrader, Jeannett Senior, MD  09/27/19 0438  

## 2019-09-27 ENCOUNTER — Emergency Department (HOSPITAL_COMMUNITY): Payer: Self-pay

## 2019-09-27 LAB — URINALYSIS, ROUTINE W REFLEX MICROSCOPIC
Bacteria, UA: NONE SEEN
Bilirubin Urine: NEGATIVE
Glucose, UA: NEGATIVE mg/dL
Hgb urine dipstick: NEGATIVE
Ketones, ur: 80 mg/dL — AB
Leukocytes,Ua: NEGATIVE
Nitrite: NEGATIVE
Protein, ur: 30 mg/dL — AB
Specific Gravity, Urine: 1.016 (ref 1.005–1.030)
pH: 6 (ref 5.0–8.0)

## 2019-09-27 LAB — PREGNANCY, URINE: Preg Test, Ur: NEGATIVE

## 2019-09-27 MED ORDER — IOHEXOL 300 MG/ML  SOLN
100.0000 mL | Freq: Once | INTRAMUSCULAR | Status: AC | PRN
  Administered 2019-09-27: 100 mL via INTRAVENOUS

## 2019-09-27 MED ORDER — SODIUM CHLORIDE 0.9 % IV BOLUS
1000.0000 mL | Freq: Once | INTRAVENOUS | Status: AC
  Administered 2019-09-27: 1000 mL via INTRAVENOUS

## 2019-09-27 MED ORDER — POTASSIUM CHLORIDE ER 20 MEQ PO TBCR
20.0000 meq | EXTENDED_RELEASE_TABLET | Freq: Every day | ORAL | 0 refills | Status: DC
Start: 2019-09-27 — End: 2021-10-24

## 2019-09-27 MED ORDER — POTASSIUM CHLORIDE CRYS ER 20 MEQ PO TBCR
40.0000 meq | EXTENDED_RELEASE_TABLET | Freq: Once | ORAL | Status: AC
  Administered 2019-09-27: 40 meq via ORAL
  Filled 2019-09-27: qty 2

## 2019-09-27 NOTE — Discharge Instructions (Signed)
Keep yourself hydrated.  Take the nausea medication as prescribed and finish the antibiotics.  You will be called if your stool culture is positive.  Follow-up with your doctor.  Return to the ED if worsening pain, fever, unable to eat or drink or any concerns.

## 2021-10-20 ENCOUNTER — Emergency Department (HOSPITAL_COMMUNITY)
Admission: EM | Admit: 2021-10-20 | Discharge: 2021-10-20 | Disposition: A | Discharge status: Home / Self Care | Payer: Self-pay | Attending: Emergency Medicine | Admitting: Emergency Medicine

## 2021-10-20 ENCOUNTER — Encounter (HOSPITAL_COMMUNITY): Payer: Self-pay

## 2021-10-20 ENCOUNTER — Other Ambulatory Visit: Payer: Self-pay

## 2021-10-20 ENCOUNTER — Emergency Department (HOSPITAL_COMMUNITY): Payer: Self-pay

## 2021-10-20 DIAGNOSIS — M546 Pain in thoracic spine: Secondary | ICD-10-CM | POA: Insufficient documentation

## 2021-10-20 DIAGNOSIS — M545 Low back pain, unspecified: Secondary | ICD-10-CM | POA: Insufficient documentation

## 2021-10-20 LAB — CBC WITH DIFFERENTIAL/PLATELET
Abs Immature Granulocytes: 0.01 10*3/uL (ref 0.00–0.07)
Basophils Absolute: 0 10*3/uL (ref 0.0–0.1)
Basophils Relative: 0 %
Eosinophils Absolute: 0.1 10*3/uL (ref 0.0–0.5)
Eosinophils Relative: 1 %
HCT: 39.8 % (ref 36.0–46.0)
Hemoglobin: 13.4 g/dL (ref 12.0–15.0)
Immature Granulocytes: 0 %
Lymphocytes Relative: 27 %
Lymphs Abs: 2 10*3/uL (ref 0.7–4.0)
MCH: 29.2 pg (ref 26.0–34.0)
MCHC: 33.7 g/dL (ref 30.0–36.0)
MCV: 86.7 fL (ref 80.0–100.0)
Monocytes Absolute: 0.6 10*3/uL (ref 0.1–1.0)
Monocytes Relative: 8 %
Neutro Abs: 4.6 10*3/uL (ref 1.7–7.7)
Neutrophils Relative %: 64 %
Platelets: 325 10*3/uL (ref 150–400)
RBC: 4.59 MIL/uL (ref 3.87–5.11)
RDW: 12.9 % (ref 11.5–15.5)
WBC: 7.2 10*3/uL (ref 4.0–10.5)
nRBC: 0 % (ref 0.0–0.2)

## 2021-10-20 LAB — HEPATIC FUNCTION PANEL
ALT: 22 U/L (ref 0–44)
AST: 21 U/L (ref 15–41)
Albumin: 4 g/dL (ref 3.5–5.0)
Alkaline Phosphatase: 60 U/L (ref 38–126)
Bilirubin, Direct: 0.1 mg/dL (ref 0.0–0.2)
Indirect Bilirubin: 0.2 mg/dL — ABNORMAL LOW (ref 0.3–0.9)
Total Bilirubin: 0.3 mg/dL (ref 0.3–1.2)
Total Protein: 8.3 g/dL — ABNORMAL HIGH (ref 6.5–8.1)

## 2021-10-20 LAB — URINALYSIS, ROUTINE W REFLEX MICROSCOPIC
Bilirubin Urine: NEGATIVE
Glucose, UA: NEGATIVE mg/dL
Hgb urine dipstick: NEGATIVE
Ketones, ur: NEGATIVE mg/dL
Leukocytes,Ua: NEGATIVE
Nitrite: NEGATIVE
Protein, ur: NEGATIVE mg/dL
Specific Gravity, Urine: 1.016 (ref 1.005–1.030)
pH: 7 (ref 5.0–8.0)

## 2021-10-20 LAB — BASIC METABOLIC PANEL
Anion gap: 7 (ref 5–15)
BUN: 10 mg/dL (ref 6–20)
CO2: 24 mmol/L (ref 22–32)
Calcium: 8.9 mg/dL (ref 8.9–10.3)
Chloride: 106 mmol/L (ref 98–111)
Creatinine, Ser: 0.61 mg/dL (ref 0.44–1.00)
GFR, Estimated: >60 mL/min (ref >60)
Glucose, Bld: 87 mg/dL (ref 70–99)
Potassium: 3.6 mmol/L (ref 3.5–5.1)
Sodium: 137 mmol/L (ref 135–145)

## 2021-10-20 LAB — PREGNANCY, URINE: Preg Test, Ur: NEGATIVE

## 2021-10-20 LAB — LACTATE DEHYDROGENASE: LDH: 115 U/L (ref 98–192)

## 2021-10-20 MED ORDER — OXYCODONE-ACETAMINOPHEN 5-325 MG PO TABS
1.0000 | ORAL_TABLET | Freq: Once | ORAL | Status: AC
  Administered 2021-10-20: 1 via ORAL
  Filled 2021-10-20: qty 1

## 2021-10-20 MED ORDER — OXYCODONE HCL 5 MG PO TABS: 5.0000 mg | ORAL_TABLET | ORAL | 0 refills | Status: DC | PRN

## 2021-10-20 MED ORDER — OXYCODONE-ACETAMINOPHEN 5-325 MG PO TABS: 1.0000 | ORAL_TABLET | Freq: Four times a day (QID) | ORAL | 0 refills | Status: DC | PRN

## 2021-10-20 NOTE — Discharge Instructions (Signed)
Someone from Dr. Marice Potter office will call you to arrange follow-up appointment to be seen in his office and arrange for MRI.  Return to the emergency department for any new or worsening symptoms.

## 2021-10-20 NOTE — ED Notes (Signed)
Patient transported to CT 

## 2021-10-20 NOTE — ED Triage Notes (Signed)
Pt presents with 2 day hx of back pain that started after she picked up a 5 gallon bucket. Pt denies urinary or fecal incontinence.

## 2021-10-20 NOTE — ED Provider Notes (Signed)
[facility] Provider Note   CSN: [id number] Arrival date & time: 10/20/21  1537     History  Chief Complaint  Patient presents with   Back Pain    Natasha Lowery is a 31 y.o. female.   Back Pain Associated symptoms: no abdominal pain, no chest pain, no dysuria, no fever, no numbness and no weakness         Natasha Lowery is a 31 y.o. female who presents to the Emergency Department complaining of 2-day history of right-sided low back pain.  She noticed pain began after picking up a 5 gallon bucket of water.  She felt a pain to her back and lay down and took a nap.  When she woke 2 hours later, she noticed severe pain that was radiating from her right flank into her left back and right buttock.  She has pain associated with movement and with sitting in a chair.  Pain somewhat improves when supine.  She denies any pain radiating into her legs, abd pain, urine or bowel changes, fever or chills, nausea or vomiting, pain numbness or weakness of her lower extremities.  No history of chronic back pain.  Does have history of kidney stones but states current pain does not feel similar   Home Medications Prior to Admission medications   Medication Sig Start Date End Date Taking? Authorizing Provider  azithromycin (ZITHROMAX) 500 MG tablet Take 1 tablet (500 mg total) by mouth daily. Take first 2 tablets together, then 1 every day until finished. 09/25/19   Recardo Evangelist, PA-C  etonogestrel (NEXPLANON) 68 MG IMPL implant 1 each by Subdermal route once.    [provider]  HYDROcodone-acetaminophen (NORCO/VICODIN) 5-325 MG tablet Take 1 tablet by mouth every 4 (four) hours as needed. 09/25/19   Recardo Evangelist, PA-C  ondansetron (ZOFRAN ODT) 4 MG disintegrating tablet Take 1 tablet (4 mg total) by mouth every 8 (eight) hours as needed for nausea or vomiting. 09/25/19   Recardo Evangelist, PA-C  potassium chloride 20 MEQ TBCR Take 20 mEq by mouth daily.  09/27/19   Ezequiel Essex, MD      Allergies    Patient has no known allergies.    Review of Systems   Review of Systems  Constitutional:  Negative for appetite change, chills and fever.  Respiratory:  Negative for cough and shortness of breath.   Cardiovascular:  Negative for chest pain.  Gastrointestinal:  Negative for abdominal pain, diarrhea, nausea and vomiting.  Genitourinary:  Positive for flank pain. Negative for decreased urine volume, difficulty urinating, dysuria, hematuria, vaginal bleeding and vaginal discharge.  Musculoskeletal:  Positive for back pain.  Skin:  Negative for color change, rash and wound.  Neurological:  Negative for weakness and numbness.    Physical Exam Updated Vital Signs BP (!) 145/91 (BP Location: Right Arm)   Pulse (!) 110   Temp 98.8 F (37.1 C) (Oral)   Resp 17   Ht _0  (1.676 m)   Wt 93 kg   LMP 06/23/2021 Comment: continuous contraceptive  SpO2 100%   BMI 33.09 kg/m  Physical Exam Vitals and nursing note reviewed.  Constitutional:      General: She is not in acute distress.    Appearance: Normal appearance. She is not toxic-appearing.     Comments: Patient is uncomfortable appearing.  Nontoxic  Cardiovascular:     Rate and Rhythm: Regular rhythm. Tachycardia present.     Pulses: Normal pulses.  Pulmonary:  Effort: Pulmonary effort is normal.  Chest:     Chest wall: No tenderness.  Abdominal:     General: There is no distension.     Palpations: Abdomen is soft.     Tenderness: There is no abdominal tenderness. There is no right CVA tenderness or left CVA tenderness.  Musculoskeletal:        General: Tenderness present.     Thoracic back: Tenderness present.     Lumbar back: Tenderness present. No swelling or deformity. Positive right straight leg raise test.     Right lower leg: No edema.     Left lower leg: No edema.     Comments: Tenderness to palpation of the entire right thoracic and lumbar paraspinal muscles.  No  midline tenderness.  Pain reproduced with straight leg raise on the right.  Skin:    General: Skin is warm.     Capillary Refill: Capillary refill takes less than 2 seconds.     Findings: No rash.  Neurological:     General: No focal deficit present.     Mental Status: She is alert.     Sensory: No sensory deficit.     Motor: No weakness.    ED Results / Procedures / Treatments   Labs (all labs ordered are listed, but only abnormal results are displayed) Labs Reviewed  URINALYSIS, ROUTINE W REFLEX MICROSCOPIC - Abnormal; Notable for the following components:      Result Value   APPearance HAZY (*)    All other components within normal limits  HEPATIC FUNCTION PANEL - Abnormal; Notable for the following components:   Total Protein 8.3 (*)    Indirect Bilirubin 0.2 (*)    All other components within normal limits  CBC WITH DIFFERENTIAL/PLATELET  BASIC METABOLIC PANEL  PREGNANCY, URINE  LACTATE DEHYDROGENASE  AFP TUMOR MARKER    EKG None  Radiology CT Renal Stone Study  Result Date: 10/20/2021 CLINICAL DATA:  Bilateral lower back pain x2 days. EXAM: CT ABDOMEN AND PELVIS WITHOUT CONTRAST TECHNIQUE: Multidetector CT imaging of the abdomen and pelvis was performed following the standard protocol without IV contrast. RADIATION DOSE REDUCTION: This exam was performed according to the departmental dose-optimization program which includes automated exposure control, adjustment of the mA and/or kV according to patient size and/or use of iterative reconstruction technique. COMPARISON:  September 27, 2019 FINDINGS: Lower chest: Mild atelectasis is seen within the bilateral lung bases. A trace amount of pleural fluid is also seen bilaterally. Hepatobiliary: A large area of heterogeneous low attenuation is seen throughout a large portion of the right lobe of the liver. This measures approximately 10.4 cm x 13.6 cm x 21.9 cm and represents a new finding. No gallstones, gallbladder wall thickening, or  biliary dilatation. Pancreas: Unremarkable. No pancreatic ductal dilatation or surrounding inflammatory changes. Spleen: Normal in size without focal abnormality. Adrenals/Urinary Tract: Adrenal glands are unremarkable. Kidneys are normal, without renal calculi, focal lesion, or hydronephrosis. Bladder is unremarkable. Stomach/Bowel: Stomach is within normal limits. Appendix appears normal. No evidence of bowel wall thickening, distention, or inflammatory changes. Vascular/Lymphatic: Aortic atherosclerosis. There is mild bilateral inguinal lymphadenopathy. Reproductive: The uterus is unremarkable. A 1.6 cm simple cyst is seen within the left adnexa. Other: No abdominal wall hernia or abnormality. No abdominopelvic ascites. Musculoskeletal: No acute or significant osseous findings. IMPRESSION: 1. Large area of heterogeneous low attenuation throughout a large portion of the right lobe of the liver. This represents a new finding and is concerning for the presence of  an underlying neoplastic process. MRI correlation is recommended. 2. Mild bilateral inguinal lymphadenopathy, likely reactive. 3. 1.6 cm simple cyst within the left adnexa. No follow-up imaging is recommended. Reference: JACR 2020 Feb;17(2):248-254 4. Aortic atherosclerosis. Aortic Atherosclerosis (ICD10-I70.0). Electronically Signed   By: Virgina Norfolk M.D.   On: 10/20/2021 21:07    Procedures Procedures    Medications Ordered in ED Medications  oxyCODONE-acetaminophen (PERCOCET/ROXICET) 5-325 MG per tablet 1 tablet (has no administration in time range)    ED Course/ Medical Decision Making/ A&P                           Medical Decision Making Patient here with 2-day history of back pain.  No known injury but states that she lifted a 5 gallon bucket of water noticed pain in her back that has gradually worsened pain radiates to her left back and right buttock as well.  No symptoms of either lower extremity.  No abdominal tenderness.   Denies any urine or bowel changes.  On exam, patient has diffuse tenderness to the right thoracic and lumbar paraspinal muscles.  She has positive straight leg raise on the right.  Her abdomen is soft and nontender without guarding or rebound.  Differential would include musculoskeletal injury, kidney stone, pyelonephritis, doubt spinal abscess   Amount and/or Complexity of Data Reviewed Labs: ordered.    Details: Labs interpreted by me, no evidence of leukocytosis, chemistries unremarkable, hepatic function panel show total protein of 8.3, AST, ALT, alk phos and total bilirubin unremarkable.  Urinalysis without evidence of infection and urine pregnancy test is negative. Radiology: ordered.    Details: CT renal stone study shows large area of heterogeneous low-attenuation throughout a large portion of the right lobe of the liver, this is a new finding and concerning for underlying neoplastic process. Discussion of management or test interpretation with external provider(s): Discussed findings with oncology, Dr. Delton Coombes.  LDH and AFP tumor marker added at his request.  He will see patient in office for follow-up and arrange MRI  Risk Prescription drug management.           Final Clinical Impression(s) / ED Diagnoses Final diagnoses:  Acute right-sided thoracic back pain  Acute right-sided low back pain without sciatica    Rx / DC Orders ED Discharge Orders     None         Kem Parkinson, PA-C 10/22/21 2223    Daleen Bo, MD 10/24/21 [phone]

## 2021-10-20 NOTE — ED Notes (Signed)
Lab at bedside

## 2021-10-21 ENCOUNTER — Ambulatory Visit (HOSPITAL_COMMUNITY)
Admission: RE | Admit: 2021-10-21 | Discharge: 2021-10-21 | Disposition: A | Discharge status: Home / Self Care | Payer: Medicaid Other | Source: Ambulatory Visit | Attending: Hematology | Admitting: Hematology

## 2021-10-21 ENCOUNTER — Inpatient Hospital Stay (HOSPITAL_COMMUNITY): Payer: Self-pay | Attending: Hematology | Admitting: Hematology

## 2021-10-21 ENCOUNTER — Encounter (HOSPITAL_COMMUNITY): Payer: Self-pay | Admitting: Hematology

## 2021-10-21 VITALS — BP 137/87 | HR 94 | Temp 97.7°F | Resp 16 | Ht 66.0 in | Wt 203.8 lb

## 2021-10-21 DIAGNOSIS — K769 Liver disease, unspecified: Secondary | ICD-10-CM

## 2021-10-21 DIAGNOSIS — J90 Pleural effusion, not elsewhere classified: Secondary | ICD-10-CM | POA: Insufficient documentation

## 2021-10-21 DIAGNOSIS — M545 Low back pain, unspecified: Secondary | ICD-10-CM | POA: Insufficient documentation

## 2021-10-21 DIAGNOSIS — Z87442 Personal history of urinary calculi: Secondary | ICD-10-CM | POA: Insufficient documentation

## 2021-10-21 DIAGNOSIS — R160 Hepatomegaly, not elsewhere classified: Secondary | ICD-10-CM | POA: Insufficient documentation

## 2021-10-21 DIAGNOSIS — K7689 Other specified diseases of liver: Secondary | ICD-10-CM | POA: Insufficient documentation

## 2021-10-21 DIAGNOSIS — M5126 Other intervertebral disc displacement, lumbar region: Secondary | ICD-10-CM | POA: Insufficient documentation

## 2021-10-21 DIAGNOSIS — K760 Fatty (change of) liver, not elsewhere classified: Secondary | ICD-10-CM | POA: Insufficient documentation

## 2021-10-21 DIAGNOSIS — Z800 Family history of malignant neoplasm of digestive organs: Secondary | ICD-10-CM | POA: Insufficient documentation

## 2021-10-21 MED ORDER — GADOBUTROL 1 MMOL/ML IV SOLN
9.0000 mL | Freq: Once | INTRAVENOUS | Status: AC | PRN
  Administered 2021-10-21: 9 mL via INTRAVENOUS

## 2021-10-21 MED FILL — Oxycodone w/ Acetaminophen Tab 5-325 MG: ORAL | Qty: 6 | Status: AC

## 2021-10-21 NOTE — Progress Notes (Signed)
[facility] [address], [place] [address]   CLINIC:  Medical Oncology/Hematology  CONSULT NOTE  Patient Care Team: Wayland Denis, PA-C as PCP - General (Physician Assistant) Derek Jack, MD as Medical Oncologist (Medical Oncology) Brien Mates, RN as Oncology Nurse Navigator (Medical Oncology)  CHIEF COMPLAINTS/PURPOSE OF CONSULTATION:  Evaluation for right liver mass  HISTORY OF PRESENTING ILLNESS:  [name] 31 y.o. female is here because of evaluation for right liver mass, at the request of the ED.  Today she reports feeling fair. She reports severe sharp lower back pain and right lower back distension since 7/4; she reports the pain began started are soreness at the center of her spine after waking up from a nap. Prior to the nap she had carried a 5 gallon water jug to feed her chickens. The pain is worse while sitting; leaning to her left slightly relieves the pain. She denies increased pain with urination. She denies ever having similar back pain. She reports there is pain in her right leg with movement. Her appetite had been decreased due to the pain. She reports nausea since starting Oxycodone last night. The oxycodone has helped, and she reports without it she has to be assisted to sit up right. She reports she had a kidney stone in her left kidney 7 years ago during a pregnancy. She has kittens and reports she will often have small scratches on her legs. She had Nexplanon removed in November 2022 and started [facility] at that time.   She is a stay-at-home mom. She denies smoking history and alcohol consumption. She denies family history of cancer. Her maternal grandmother passed from lupus.   MEDICAL HISTORY:  Past Medical History:  Diagnosis Date   Heavy periods    Irregular periods     SURGICAL HISTORY: Past Surgical History:  Procedure Laterality Date   none      SOCIAL HISTORY: Social History   Socioeconomic  History   Marital status: Married    Spouse name: Not on file   Number of children: Not on file   Years of education: Not on file   Highest education level: Not on file  Occupational History   Not on file  Tobacco Use   Smoking status: Never   Smokeless tobacco: Never  Vaping Use   Vaping Use: Never used  Substance and Sexual Activity   Alcohol use: Yes    Comment: rare   Drug use: No   Sexual activity: Yes    Birth control/protection: None, Implant  Other Topics Concern   Not on file  Social History Narrative   Not on file   Social Determinants of Health   Financial Resource Strain: Not on file  Food Insecurity: Not on file  Transportation Needs: Not on file  Physical Activity: Not on file  Stress: Not on file  Social Connections: Not on file  Intimate Partner Violence: Not on file    FAMILY HISTORY: Family History  Problem Relation Age of Onset   Colon cancer Maternal Grandmother        grt mat gm   Lupus Maternal Grandmother        deceased due to Lupus   Diabetes Maternal Grandfather    Diabetes Paternal Grandmother    Heart disease Paternal Grandmother    Lupus Mother        in remission   Breast cancer Neg Hx    Ovarian cancer Neg Hx  ALLERGIES:  has No Known Allergies.  MEDICATIONS:  Current Outpatient Medications  Medication Sig Dispense Refill   DULOXETINE HCL PO Take by mouth.     HYDROcodone-acetaminophen (NORCO/VICODIN) 5-325 MG tablet Take 1 tablet by mouth every 4 (four) hours as needed. 10 tablet 0   Norethindrone Acetate-Ethinyl Estrad-FE (BLISOVI 24 FE) 1-20 MG-MCG(24) tablet Take by mouth.     oxyCODONE (ROXICODONE) 5 MG immediate release tablet Take 1 tablet (5 mg total) by mouth every 4 (four) hours as needed for severe pain. 6 tablet 0   oxyCODONE-acetaminophen (PERCOCET/ROXICET) 5-325 MG tablet Take 1 tablet by mouth every 6 (six) hours as needed for severe pain. 6 tablet 0   potassium chloride 20 MEQ TBCR Take 20 mEq by mouth  daily. 3 tablet 0   ondansetron (ZOFRAN ODT) 4 MG disintegrating tablet Take 1 tablet (4 mg total) by mouth every 8 (eight) hours as needed for nausea or vomiting. (Patient not taking: Reported on 10/21/2021) 8 tablet 0   Current Facility-Administered Medications  Medication Dose Route Frequency Provider Last Rate Last Admin   etonogestrel (NEXPLANON) implant 68 mg  68 mg Subdermal Once Katie, Melody N, CNM        REVIEW OF SYSTEMS:   Review of Systems  Constitutional:  Positive for appetite change. Negative for fatigue.  Respiratory:  Positive for cough.   Gastrointestinal:  Positive for nausea.  Genitourinary:  Negative for dysuria.   Musculoskeletal:  Positive for back pain (5/10 lower).  Neurological:  Positive for headaches.  Psychiatric/Behavioral:  Positive for sleep disturbance. The patient is nervous/anxious.   All other systems reviewed and are negative.    PHYSICAL EXAMINATION: ECOG PERFORMANCE STATUS: 0 - Asymptomatic  Vitals:   10/21/21 1127  BP: 137/87  Pulse: 94  Resp: 16  Temp: 97.7 F (36.5 C)  SpO2: 99%   Filed Weights   10/21/21 1127  Weight: 203 lb 12.8 oz (92.4 kg)   Physical Exam Vitals reviewed.  Constitutional:      Appearance: Normal appearance. She is obese.  Cardiovascular:     Rate and Rhythm: Normal rate and regular rhythm.     Pulses: Normal pulses.     Heart sounds: Normal heart sounds.  Pulmonary:     Effort: Pulmonary effort is normal.     Breath sounds: Normal breath sounds.  Abdominal:     Palpations: Abdomen is soft. There is no hepatomegaly, splenomegaly or mass.     Tenderness: There is no abdominal tenderness.  Musculoskeletal:     Lumbar back: Tenderness present.     Comments: R sided lower back tenderness  Lymphadenopathy:     Lower Body: No right inguinal adenopathy. No left inguinal adenopathy.  Neurological:     General: No focal deficit present.     Mental Status: She is alert and oriented to person, place, and  time.  Psychiatric:        Mood and Affect: Mood normal.        Behavior: Behavior normal.      LABORATORY DATA:  I have reviewed the data as listed    Latest Ref Rng & Units 10/20/2021    7:43 PM 09/26/2019   10:13 PM 09/25/2019   11:15 AM  CBC  WBC 4.0 - 10.5 K/uL 7.2  4.7  10.1   Hemoglobin 12.0 - 15.0 g/dL 13.4  13.7  14.5   Hematocrit 36.0 - 46.0 % 39.8  40.5  42.7   Platelets 150 - 400 K/uL 325  206  212       Latest Ref Rng & Units 10/20/2021    7:43 PM 09/26/2019   10:13 PM 09/25/2019   11:15 AM  CMP  Glucose 70 - 99 mg/dL 87  95  585   BUN 6 - 20 mg/dL 10  <5  6   Creatinine 0.44 - 1.00 mg/dL 2.77  8.24  2.35   Sodium 135 - 145 mmol/L 137  135  134   Potassium 3.5 - 5.1 mmol/L 3.6  3.3  3.1   Chloride 98 - 111 mmol/L 106  103  104   CO2 22 - 32 mmol/L 24  18  17    Calcium 8.9 - 10.3 mg/dL 8.9  8.8  8.7   Total Protein 6.5 - 8.1 g/dL 8.3  7.4  7.7   Total Bilirubin 0.3 - 1.2 mg/dL 0.3  0.6  0.8   Alkaline Phos 38 - 126 U/L 60  43  52   AST 15 - 41 U/L 21  18  16    ALT 0 - 44 U/L 22  16  19      RADIOGRAPHIC STUDIES: I have personally reviewed the radiological images as listed and agreed with the findings in the report. MR Abdomen W Wo Contrast  Result Date: 10/21/2021 CLINICAL DATA:  Characterize liver lesion incidentally identified by prior CT EXAM: MRI ABDOMEN WITHOUT AND WITH CONTRAST TECHNIQUE: Multiplanar multisequence MR imaging of the abdomen was performed both before and after the administration of intravenous contrast. CONTRAST:  2mL GADAVIST GADOBUTROL 1 MMOL/ML IV SOLN COMPARISON:  CT abdomen pelvis, 10/20/2021 FINDINGS: Lower chest: Trace bilateral pleural effusions. Hepatobiliary: Geographic hepatic steatosis involving a large portion of the liver dome and right lobe of the liver (series 12, image 38). Hepatomegaly, maximum coronal span 22.6 cm. No mass or other parenchymal abnormality identified. No gallstones. No biliary ductal dilatation. Pancreas: No mass,  inflammatory changes, or other parenchymal abnormality identified.No pancreatic ductal dilatation. Spleen: Within normal limits in size and appearance. Incidental small benign cyst of the posterior spleen (series 18, image 50). Adrenals/Urinary Tract: Normal adrenal glands. No renal masses or suspicious contrast enhancement identified. No evidence of hydronephrosis. Stomach/Bowel: Visualized portions within the abdomen are unremarkable. Vascular/Lymphatic: No pathologically enlarged lymph nodes identified. No abdominal aortic aneurysm demonstrated. The portal and hepatic veins are patent. Other:  None. Musculoskeletal: No suspicious osseous lesions identified. IMPRESSION: 1. Geographic hepatic steatosis involving a large portion of the liver dome and right lobe of the liver, corresponding to finding of prior CT. No suspicious mass or contrast enhancement. The portal and hepatic veins are patent. 2. Hepatomegaly. 3. Trace bilateral pleural effusions. Electronically Signed   By: 12/22/2021 M.D.   On: 10/21/2021 14:04   MR Lumbar Spine W Wo Contrast  Result Date: 10/21/2021 CLINICAL DATA:  Extreme low back pain for 2 days.  No known injury EXAM: MRI LUMBAR SPINE WITHOUT AND WITH CONTRAST TECHNIQUE: Multiplanar and multiecho pulse sequences of the lumbar spine were obtained without and with intravenous contrast. CONTRAST:  50mL GADAVIST GADOBUTROL 1 MMOL/ML IV SOLN COMPARISON:  None similar FINDINGS: Segmentation: Transitional lumbosacral vertebra numbered L5, incompletely segmented at the level of the right transverse process. Alignment:  Straightening of the lumbar spine Vertebrae:  No fracture, evidence of discitis, or bone lesion. Conus medullaris and cauda equina: Conus extends to the T12-L1 level. Conus and cauda equina appear normal. Paraspinal and other soft tissues: Reported on dedicated abdominal MRI. Disc levels: T12- L1: Unremarkable. L1-L2: Unremarkable.  L2-L3: Small right paracentral protrusion.  L3-L4: Disc desiccation and narrowing with bulge. Posterior annular fissure. L4-L5: Disc desiccation and narrowing with small central protrusion. Negative facets. L5-S1:Unremarkable. 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 No neural impingement or visible inflammation. Electronically Signed   By: Jorje Guild M.D.   On: 10/21/2021 13:52   CT Renal Stone Study  Result Date: 10/20/2021 CLINICAL DATA:  Bilateral lower back pain x2 days. EXAM: CT ABDOMEN AND PELVIS WITHOUT CONTRAST TECHNIQUE: Multidetector CT imaging of the abdomen and pelvis was performed following the standard protocol without IV contrast. RADIATION DOSE REDUCTION: This exam was performed according to the departmental dose-optimization program which includes automated exposure control, adjustment of the mA and/or kV according to patient size and/or use of iterative reconstruction technique. COMPARISON:  September 27, 2019 FINDINGS: Lower chest: Mild atelectasis is seen within the bilateral lung bases. A trace amount of pleural fluid is also seen bilaterally. Hepatobiliary: A large area of heterogeneous low attenuation is seen throughout a large portion of the right lobe of the liver. This measures approximately 10.4 cm x 13.6 cm x 21.9 cm and represents a new finding. No gallstones, gallbladder wall thickening, or biliary dilatation. Pancreas: Unremarkable. No pancreatic ductal dilatation or surrounding inflammatory changes. Spleen: Normal in size without focal abnormality. Adrenals/Urinary Tract: Adrenal glands are unremarkable. Kidneys are normal, without renal calculi, focal lesion, or hydronephrosis. Bladder is unremarkable. Stomach/Bowel: Stomach is within normal limits. Appendix appears normal. No evidence of bowel wall thickening, distention, or inflammatory changes. Vascular/Lymphatic: Aortic atherosclerosis. There is mild bilateral inguinal lymphadenopathy. Reproductive: The uterus is unremarkable. A 1.6 cm  simple cyst is seen within the left adnexa. Other: No abdominal wall hernia or abnormality. No abdominopelvic ascites. Musculoskeletal: No acute or significant osseous findings. IMPRESSION: 1. Large area of heterogeneous low attenuation throughout a large portion of the right lobe of the liver. This represents a new finding and is concerning for the presence of an underlying neoplastic process. MRI correlation is recommended. 2. Mild bilateral inguinal lymphadenopathy, likely reactive. 3. 1.6 cm simple cyst within the left adnexa. No follow-up imaging is recommended. Reference: JACR 2020 Feb;17(2):248-254 4. Aortic atherosclerosis. Aortic Atherosclerosis (ICD10-I70.0). Electronically Signed   By: Virgina Norfolk M.D.   On: 10/20/2021 21:07    ASSESSMENT:  Right liver mass: - Patient seen through referral from ER for possible right liver mass. - CT renal stone protocol (10/20/2021): Large area of heterogeneous low-attenuation throughout the large portion of the right lobe of the liver.  Mild bilateral inguinal adenopathy. - She presented to the ER with lower back pain, radiating to the right buttock region.  She reported lifting 5 gallon water and developed some soreness in the center of the spine.  She woke up with sharp pain in the lower back.  Leaning towards left to make it better.  She had a history of left kidney stone 7 years ago. - She had Nexplanon removed in November 2022 and has been on bislovi birth control since then.   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  Non-smoker.  Nonalcoholic. - No family history of malignancies.   PLAN:  Right liver mass: - I have reviewed images of the CT renal study with the patient and her husband. - I have reviewed prior scans which did not show this finding. - Differential diagnosis includes fatty liver.  She does not have any traditional risk factors for liver cancer. - AFP level is pending.  LDH is  normal. - Recommend MRI with  and without gadolinium.  2.  Low back pain: - She developed this pain after lifting 5 gallons of water. - Likely related to disc prolapse.  She is taking oxycodone every 6 hours which is helping. - Recommend lumbar MRI.   All questions were answered. The patient knows to call the clinic with any problems, questions or concerns.   Doreatha Massed, MD, 10/21/21 2:50 PM  [facility] [phone]   I, Alda Ponder, am acting as a scribe for Dr. Doreatha Massed.  I, Doreatha Massed MD, have reviewed the above documentation for accuracy and completeness, and I agree with the above.

## 2021-10-21 NOTE — Patient Instructions (Addendum)
[facility] at [facility] Discharge Instructions  You were seen and examined today by Dr. Ellin Saba. Dr. Ellin Saba is a medical oncologist, meaning that he specializes in the treatment of cancer diagnoses. Dr. Ellin Saba discussed your past medical history, family history of cancers, and the events that led to you being here today.  You were referred to Dr. Ellin Saba by the Emergency Department due to an abnormal CT scan. The CT scan revealed a possible mass on your liver that is concerning. The CT scan was looking for kidney stones and did not visualize the liver or other organs well, so additional scans are needed.  Dr. Ellin Saba has recommended a MRI to more accurately visualize the liver. Dr. Ellin Saba has also order a MRI of your lower back to see if there is a disc issue.  Follow-up as scheduled following the MRI.  Thank you for choosing [facility] at [facility] to provide your oncology and hematology care.  To afford each patient quality time with our provider, please arrive at least 15 minutes before your scheduled appointment time.   If you have a lab appointment with the Cancer Center please come in thru the Main Entrance and check in at the main information desk.  You need to re-schedule your appointment should you arrive 10 or more minutes late.  We strive to give you quality time with our providers, and arriving late affects you and other patients whose appointments are after yours.  Also, if you no show three or more times for appointments you may be dismissed from the clinic at the providers discretion.     Again, thank you for choosing [facility].  Our hope is that these requests will decrease the amount of time that you wait before being seen by our physicians.       _____________________________________________________________  Should you have questions after your visit to [facility], please contact  our office at [phone] and follow the prompts.  Our office hours are 8:00 a.m. and 4:30 p.m. Monday - Friday.  Please note that voicemails left after 4:00 p.m. may not be returned until the following business day.  We are closed weekends and major holidays.  You do have access to a nurse 24-7, just call the main number to the clinic [phone] and do not press any options, hold on the line and a nurse will answer the phone.    For prescription refill requests, have your pharmacy contact our office and allow 72 hours.

## 2021-10-22 LAB — AFP TUMOR MARKER: AFP, Serum, Tumor Marker: <1.8 ng/mL (ref 0.0–6.4)

## 2021-10-24 ENCOUNTER — Inpatient Hospital Stay (HOSPITAL_BASED_OUTPATIENT_CLINIC_OR_DEPARTMENT_OTHER): Payer: Self-pay | Admitting: Hematology

## 2021-10-24 VITALS — BP 118/89 | HR 92 | Temp 97.2°F | Resp 18 | Ht 66.0 in | Wt 201.0 lb

## 2021-10-24 DIAGNOSIS — K769 Liver disease, unspecified: Secondary | ICD-10-CM

## 2021-10-24 DIAGNOSIS — M545 Low back pain, unspecified: Secondary | ICD-10-CM

## 2021-10-24 NOTE — Progress Notes (Signed)
[facility] [address], [place] [address]   CLINIC:  Medical Oncology/Hematology  PCP:  Carren Rang, PA-C [address] [place] / [place] [address] [phone]   REASON FOR VISIT:  Follow-up for right liver mass  PRIOR THERAPY: none  NGS Results: not done  CURRENT THERAPY: under work-up  BRIEF ONCOLOGIC HISTORY:  Oncology History   No history exists.    CANCER STAGING: Cancer Staging  No matching staging information was found for the patient.  INTERVAL HISTORY:  [name], a 31 y.o. female, returns for routine follow-up of her right liver mass. Shyne was last seen on 10/21/2021.   Today she reports feeling good. Her right lower back pain has improved.   REVIEW OF SYSTEMS:  Review of Systems  Constitutional:  Negative for appetite change and fatigue.  Gastrointestinal:  Positive for nausea.  Musculoskeletal:  Positive for back pain (4/10 lower - improved).  Neurological:  Positive for dizziness, light-headedness and numbness (back).  Psychiatric/Behavioral:  The patient is nervous/anxious.   All other systems reviewed and are negative.   PAST MEDICAL/SURGICAL HISTORY:  Past Medical History:  Diagnosis Date   Heavy periods    Irregular periods    Past Surgical History:  Procedure Laterality Date   none      SOCIAL HISTORY:  Social History   Socioeconomic History   Marital status: Married    Spouse name: Not on file   Number of children: Not on file   Years of education: Not on file   Highest education level: Not on file  Occupational History   Not on file  Tobacco Use   Smoking status: Never   Smokeless tobacco: Never  Vaping Use   Vaping Use: Never used  Substance and Sexual Activity   Alcohol use: Yes    Comment: rare   Drug use: No   Sexual activity: Yes    Birth control/protection: None, Implant  Other Topics Concern   Not on file  Social History Narrative   Not on file    Social Determinants of Health   Financial Resource Strain: Not on file  Food Insecurity: Not on file  Transportation Needs: Not on file  Physical Activity: Not on file  Stress: Not on file  Social Connections: Not on file  Intimate Partner Violence: Not on file    FAMILY HISTORY:  Family History  Problem Relation Age of Onset   Colon cancer Maternal Grandmother        grt mat gm   Lupus Maternal Grandmother        deceased due to Lupus   Diabetes Maternal Grandfather    Diabetes Paternal Grandmother    Heart disease Paternal Grandmother    Lupus Mother        in remission   Breast cancer Neg Hx    Ovarian cancer Neg Hx     CURRENT MEDICATIONS:  Current Outpatient Medications  Medication Sig Dispense Refill   DULOXETINE HCL PO Take by mouth.     HYDROcodone-acetaminophen (NORCO/VICODIN) 5-325 MG tablet Take 1 tablet by mouth every 4 (four) hours as needed. 10 tablet 0   Norethindrone Acetate-Ethinyl Estrad-FE (BLISOVI 24 FE) 1-20 MG-MCG(24) tablet Take by mouth.     ondansetron (ZOFRAN ODT) 4 MG disintegrating tablet Take 1 tablet (4 mg total) by mouth every 8 (eight) hours as needed for nausea or vomiting. (Patient not taking: Reported on 10/21/2021) 8 tablet 0   oxyCODONE (ROXICODONE) 5  MG immediate release tablet Take 1 tablet (5 mg total) by mouth every 4 (four) hours as needed for severe pain. 6 tablet 0   oxyCODONE-acetaminophen (PERCOCET/ROXICET) 5-325 MG tablet Take 1 tablet by mouth every 6 (six) hours as needed for severe pain. 6 tablet 0   potassium chloride 20 MEQ TBCR Take 20 mEq by mouth daily. 3 tablet 0   Current Facility-Administered Medications  Medication Dose Route Frequency Provider Last Rate Last Admin   etonogestrel (NEXPLANON) implant 68 mg  68 mg Subdermal Once Shambley, Melody N, CNM        ALLERGIES:  No Known Allergies  PHYSICAL EXAM:  Performance status (ECOG): 0 - Asymptomatic  There were no vitals filed for this visit. Wt Readings from  Last 3 Encounters:  10/21/21 203 lb 12.8 oz (92.4 kg)  10/20/21 205 lb (93 kg)  09/26/19 190 lb (86.2 kg)   Physical Exam Vitals reviewed.  Constitutional:      Appearance: Normal appearance.  Cardiovascular:     Rate and Rhythm: Normal rate and regular rhythm.     Pulses: Normal pulses.     Heart sounds: Normal heart sounds.  Pulmonary:     Effort: Pulmonary effort is normal.     Breath sounds: Normal breath sounds.  Neurological:     General: No focal deficit present.     Mental Status: She is alert and oriented to person, place, and time.  Psychiatric:        Mood and Affect: Mood normal.        Behavior: Behavior normal.      LABORATORY DATA:  I have reviewed the labs as listed.     Latest Ref Rng & Units 10/20/2021    7:43 PM 09/26/2019   10:13 PM 09/25/2019   11:15 AM  CBC  WBC 4.0 - 10.5 K/uL 7.2  4.7  10.1   Hemoglobin 12.0 - 15.0 g/dL 76.7  34.1  93.7   Hematocrit 36.0 - 46.0 % 39.8  40.5  42.7   Platelets 150 - 400 K/uL 325  206  212       Latest Ref Rng & Units 10/20/2021    7:43 PM 09/26/2019   10:13 PM 09/25/2019   11:15 AM  CMP  Glucose 70 - 99 mg/dL 87  95  902   BUN 6 - 20 mg/dL 10  <5  6   Creatinine 0.44 - 1.00 mg/dL 4.09  7.35  3.29   Sodium 135 - 145 mmol/L 137  135  134   Potassium 3.5 - 5.1 mmol/L 3.6  3.3  3.1   Chloride 98 - 111 mmol/L 106  103  104   CO2 22 - 32 mmol/L 24  18  17    Calcium 8.9 - 10.3 mg/dL 8.9  8.8  8.7   Total Protein 6.5 - 8.1 g/dL 8.3  7.4  7.7   Total Bilirubin 0.3 - 1.2 mg/dL 0.3  0.6  0.8   Alkaline Phos 38 - 126 U/L 60  43  52   AST 15 - 41 U/L 21  18  16    ALT 0 - 44 U/L 22  16  19      DIAGNOSTIC IMAGING:  I have independently reviewed the scans and discussed with the patient. MR Abdomen W Wo Contrast  Result Date: 10/21/2021 CLINICAL DATA:  Characterize liver lesion incidentally identified by prior CT EXAM: MRI ABDOMEN WITHOUT AND WITH CONTRAST TECHNIQUE: Multiplanar multisequence MR imaging of the abdomen was  performed both before and after the administration of intravenous contrast. CONTRAST:  5mL GADAVIST GADOBUTROL 1 MMOL/ML IV SOLN COMPARISON:  CT abdomen pelvis, 10/20/2021 FINDINGS: Lower chest: Trace bilateral pleural effusions. Hepatobiliary: Geographic hepatic steatosis involving a large portion of the liver dome and right lobe of the liver (series 12, image 38). Hepatomegaly, maximum coronal span 22.6 cm. No mass or other parenchymal abnormality identified. No gallstones. No biliary ductal dilatation. Pancreas: No mass, inflammatory changes, or other parenchymal abnormality identified.No pancreatic ductal dilatation. Spleen: Within normal limits in size and appearance. Incidental small benign cyst of the posterior spleen (series 18, image 50). Adrenals/Urinary Tract: Normal adrenal glands. No renal masses or suspicious contrast enhancement identified. No evidence of hydronephrosis. Stomach/Bowel: Visualized portions within the abdomen are unremarkable. Vascular/Lymphatic: No pathologically enlarged lymph nodes identified. No abdominal aortic aneurysm demonstrated. The portal and hepatic veins are patent. Other:  None. Musculoskeletal: No suspicious osseous lesions identified. IMPRESSION: 1. Geographic hepatic steatosis involving a large portion of the liver dome and right lobe of the liver, corresponding to finding of prior CT. No suspicious mass or contrast enhancement. The portal and hepatic veins are patent. 2. Hepatomegaly. 3. Trace bilateral pleural effusions. Electronically Signed   By: Jearld Lesch M.D.   On: 10/21/2021 14:04   MR Lumbar Spine W Wo Contrast  Result Date: 10/21/2021 CLINICAL DATA:  Extreme low back pain for 2 days.  No known injury EXAM: MRI LUMBAR SPINE WITHOUT AND WITH CONTRAST TECHNIQUE: Multiplanar and multiecho pulse sequences of the lumbar spine were obtained without and with intravenous contrast. CONTRAST:  68mL GADAVIST GADOBUTROL 1 MMOL/ML IV SOLN COMPARISON:  None similar  FINDINGS: Segmentation: Transitional lumbosacral vertebra numbered L5, incompletely segmented at the level of the right transverse process. Alignment:  Straightening of the lumbar spine Vertebrae:  No fracture, evidence of discitis, or bone lesion. Conus medullaris and cauda equina: Conus extends to the T12-L1 level. Conus and cauda equina appear normal. Paraspinal and other soft tissues: Reported on dedicated abdominal MRI. Disc levels: T12- L1: Unremarkable. L1-L2: Unremarkable. L2-L3: Small right paracentral protrusion. L3-L4: Disc desiccation and narrowing with bulge. Posterior annular fissure. L4-L5: Disc desiccation and narrowing with small central protrusion. Negative facets. L5-S1:Unremarkable. 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 No neural impingement or visible inflammation. Electronically Signed   By: Tiburcio Pea M.D.   On: 10/21/2021 13:52   CT Renal Stone Study  Result Date: 10/20/2021 CLINICAL DATA:  Bilateral lower back pain x2 days. EXAM: CT ABDOMEN AND PELVIS WITHOUT CONTRAST TECHNIQUE: Multidetector CT imaging of the abdomen and pelvis was performed following the standard protocol without IV contrast. RADIATION DOSE REDUCTION: This exam was performed according to the departmental dose-optimization program which includes automated exposure control, adjustment of the mA and/or kV according to patient size and/or use of iterative reconstruction technique. COMPARISON:  September 27, 2019 FINDINGS: Lower chest: Mild atelectasis is seen within the bilateral lung bases. A trace amount of pleural fluid is also seen bilaterally. Hepatobiliary: A large area of heterogeneous low attenuation is seen throughout a large portion of the right lobe of the liver. This measures approximately 10.4 cm x 13.6 cm x 21.9 cm and represents a new finding. No gallstones, gallbladder wall thickening, or biliary dilatation. Pancreas: Unremarkable. No pancreatic ductal dilatation or  surrounding inflammatory changes. Spleen: Normal in size without focal abnormality. Adrenals/Urinary Tract: Adrenal glands are unremarkable. Kidneys are normal, without renal calculi, focal lesion, or hydronephrosis. Bladder is unremarkable. Stomach/Bowel: Stomach is within normal  limits. Appendix appears normal. No evidence of bowel wall thickening, distention, or inflammatory changes. Vascular/Lymphatic: Aortic atherosclerosis. There is mild bilateral inguinal lymphadenopathy. Reproductive: The uterus is unremarkable. A 1.6 cm simple cyst is seen within the left adnexa. Other: No abdominal wall hernia or abnormality. No abdominopelvic ascites. Musculoskeletal: No acute or significant osseous findings. IMPRESSION: 1. Large area of heterogeneous low attenuation throughout a large portion of the right lobe of the liver. This represents a new finding and is concerning for the presence of an underlying neoplastic process. MRI correlation is recommended. 2. Mild bilateral inguinal lymphadenopathy, likely reactive. 3. 1.6 cm simple cyst within the left adnexa. No follow-up imaging is recommended. Reference: JACR 2020 Feb;17(2):248-254 4. Aortic atherosclerosis. Aortic Atherosclerosis (ICD10-I70.0). Electronically Signed   By: Aram Candela M.D.   On: 10/20/2021 21:07     ASSESSMENT:  Right liver mass: - Patient seen through referral from ER for possible right liver mass. - CT renal stone protocol (10/20/2021): Large area of heterogeneous low-attenuation throughout the large portion of the right lobe of the liver.  Mild bilateral inguinal adenopathy. - She presented to the ER with lower back pain, radiating to the right buttock region.  She reported lifting 5 gallon water and developed some soreness in the center of the spine.  She woke up with sharp pain in the lower back.  Leaning towards left to make it better.  She had a history of left kidney stone 7 years ago. - She had Nexplanon removed in November 2022  and has been on bislovi birth control since then.    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  Non-smoker.  Nonalcoholic. - No family history of malignancies.   PLAN:  Geographic hepatic steatosis: - I have reviewed MRI of the liver which showed geographic hepatic steatosis involving the lower portion of the liver dome and right lobe of the liver with no suspicious mass or contrast-enhancement.  Portal and hepatic veins are patent.  There is hepatomegaly. - AFP was less than 1.8.  LDH was normal. - I have recommended weight reduction for management of hepatic steatosis. - She will follow-up with her PMD.  No follow-up appointment was given.  2.  Low back pain: - I have reviewed MRI of the lumbar spine which showed disc degeneration with small protrusions at L2-3 to L4-L5.  No neural impingement or inflammation. - She reports improvement in the pain.  She will follow-up with her PMD.   Orders placed this encounter:  No orders of the defined types were placed in this encounter.    Doreatha Massed, MD [facility] [phone]   I, Alda Ponder, am acting as a scribe for Dr. Doreatha Massed.  I, Doreatha Massed MD, have reviewed the above documentation for accuracy and completeness, and I agree with the above.

## 2022-04-22 IMAGING — CT CT ABD-PELV W/ CM
2 of 4 series · 16 of 46 positions shown, 18 images · IV contrast (Omnipaque or Isovue)
Comparison: 08/06/2017

CLINICAL DATA: Right lower quadrant pain, nausea, vomiting

EXAM:
CT ABDOMEN AND PELVIS WITH CONTRAST
TECHNIQUE: Multidetector CT imaging of the abdomen and pelvis was performed
using the standard protocol following bolus administration of
intravenous contrast.
CONTRAST:  100mL OMNIPAQUE IOHEXOL 300 MG/ML  SOLN

[Series 2: axial st · axial · 0.80mm/px · z∈[-768,-328]mm · 13 of 97 slices shown, 15 images]
[im 5/97  soft-tissue]
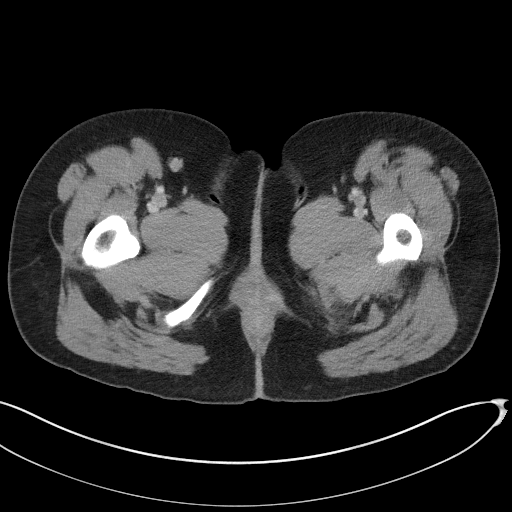
[im 5/97  bone]
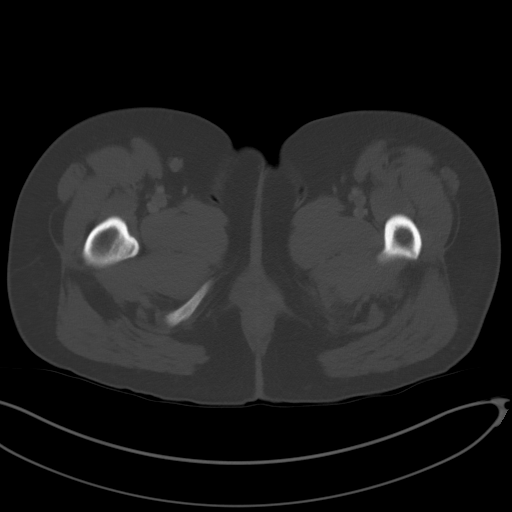
[im 13/97  soft-tissue]
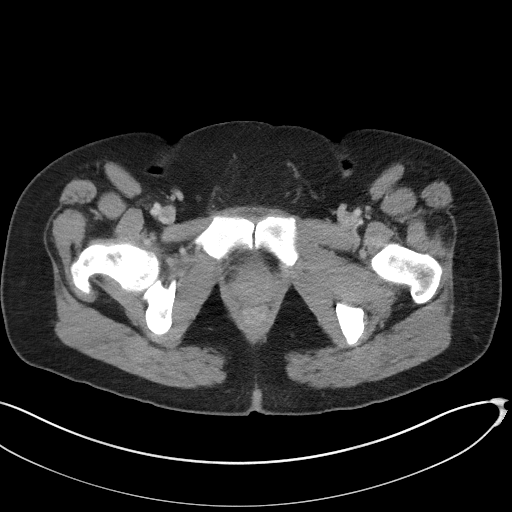
[im 21/97  soft-tissue]
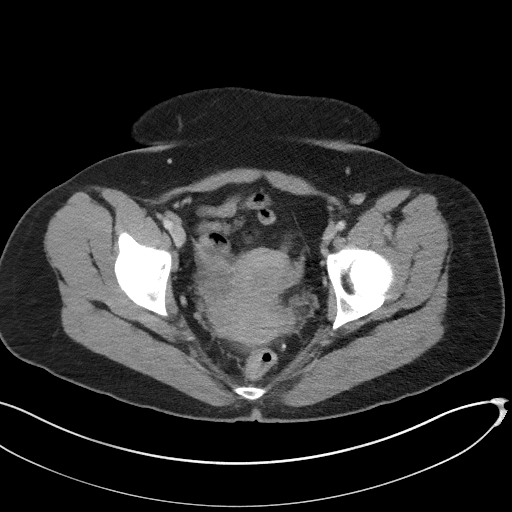
[im 29/97  soft-tissue]
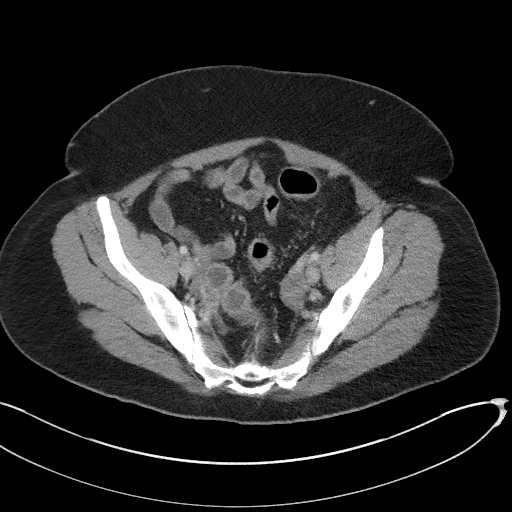
[im 33/97  soft-tissue]
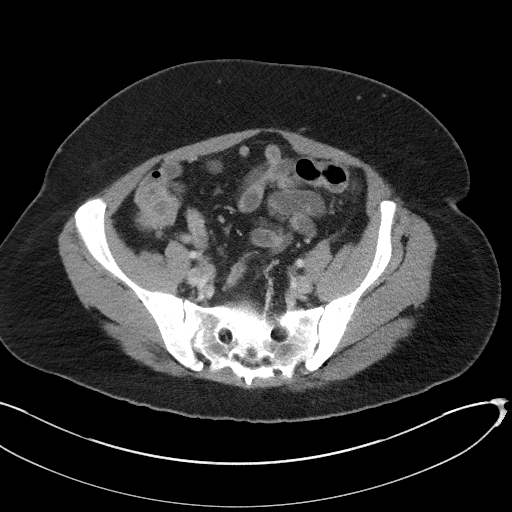
[im 41/97  soft-tissue]
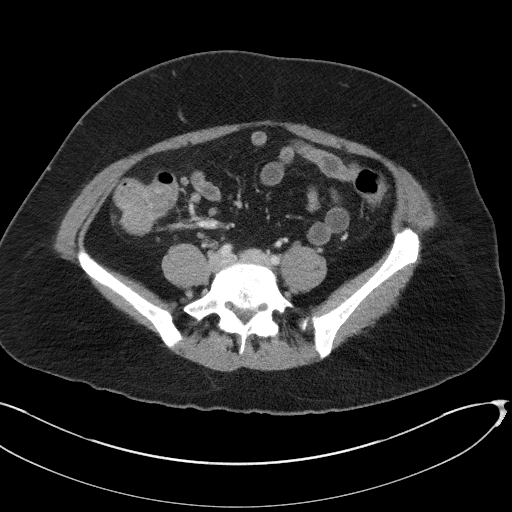
[im 49/97  soft-tissue]
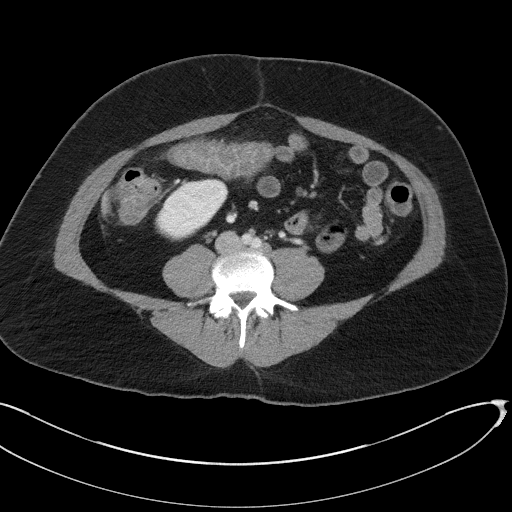
[im 57/97  soft-tissue]
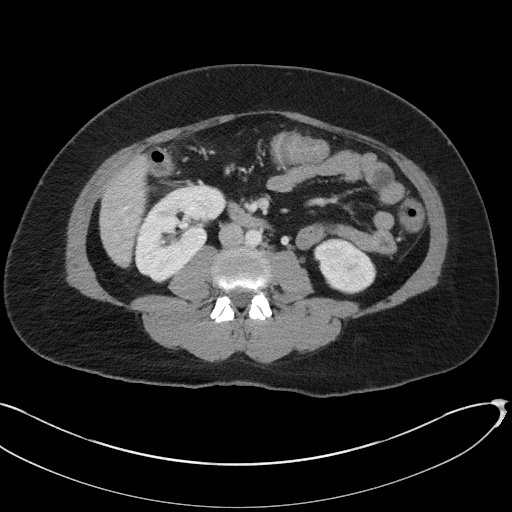
[im 65/97  soft-tissue]
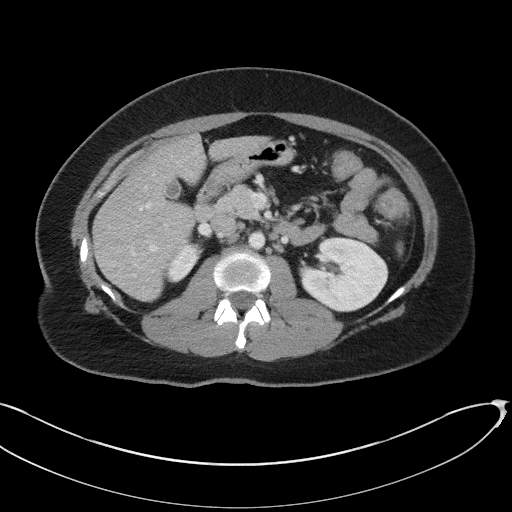
[im 65/97  bone]
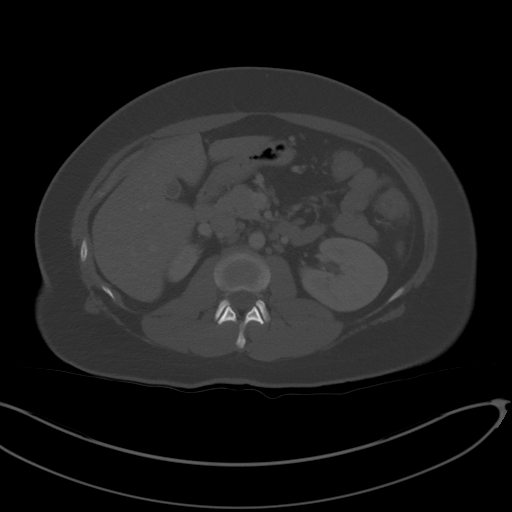
[im 69/97  soft-tissue]
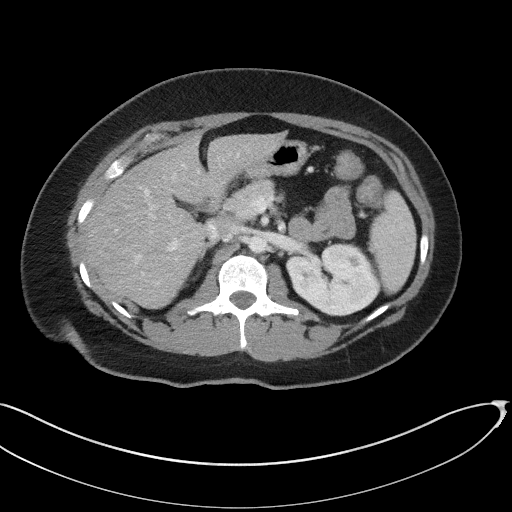
[im 77/97  soft-tissue]
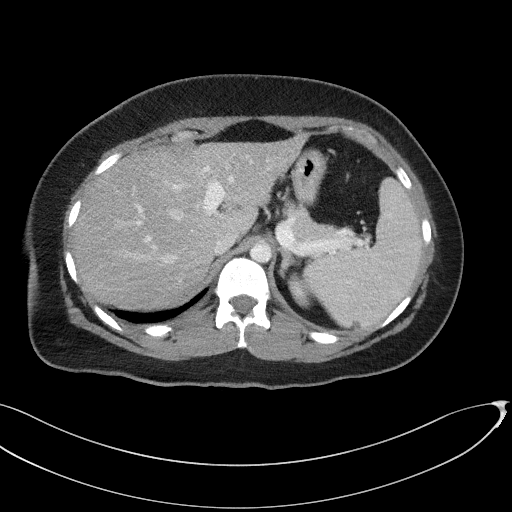
[im 85/97  soft-tissue]
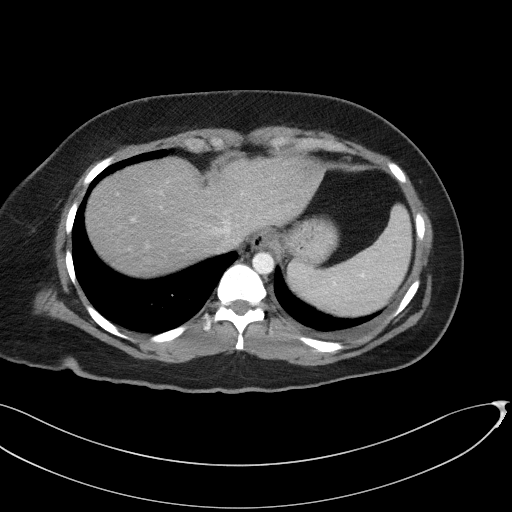
[im 93/97  soft-tissue]
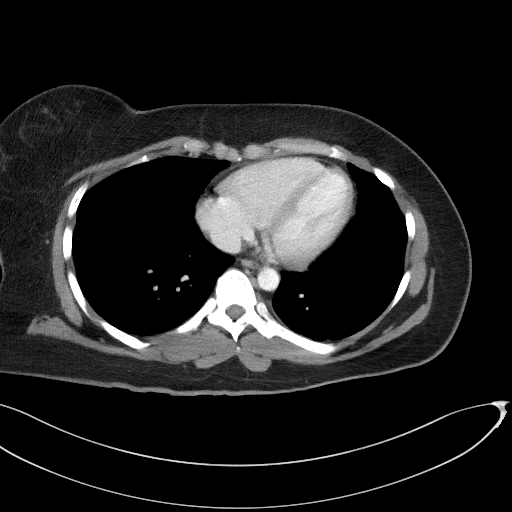

[Series 5: coronal st · coronal · 0.81mm/px · 3 of 117 slices shown]
[im 39/117  soft-tissue]
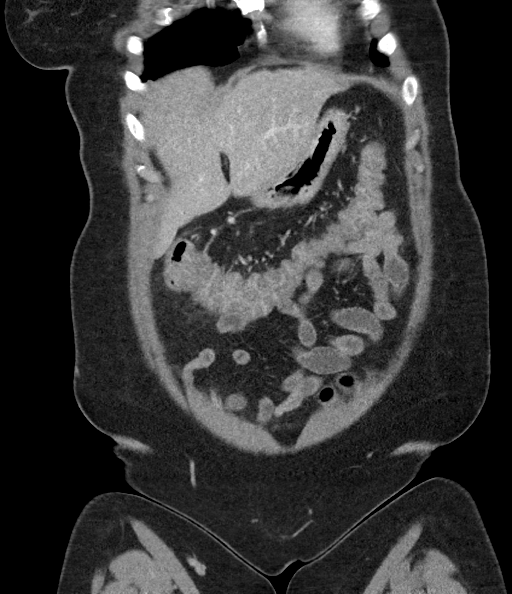
[im 52/117  soft-tissue]
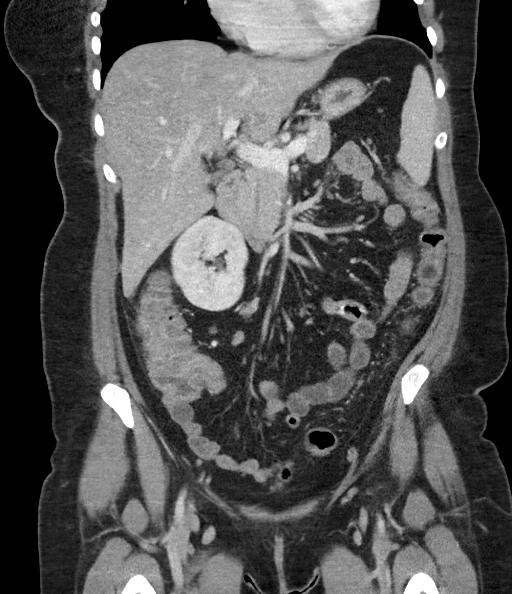
[im 65/117  soft-tissue]
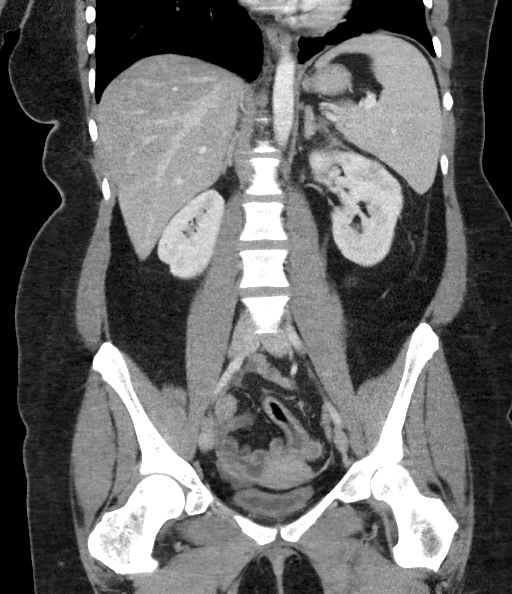

[16 of 46 positions shown; findings below may reference images not displayed]

FINDINGS: Lower chest: Lung bases are clear. No effusions. Heart is normal
size.

Hepatobiliary: Diffuse low-density throughout the liver compatible
with fatty infiltration. No focal abnormality. Gallbladder
unremarkable.

Pancreas: No focal abnormality or ductal dilatation.

Spleen: No focal abnormality.  Normal size.

Adrenals/Urinary Tract: No adrenal abnormality. No focal renal
abnormality. No stones or hydronephrosis. Urinary bladder is
unremarkable.

Stomach/Bowel: Normal appendix. Stomach, large and small bowel
grossly unremarkable.

Vascular/Lymphatic: No aneurysm. Shotty retroperitoneal and
mesenteric lymph nodes. Mesenteric lymph nodes are most prominent in
the right lower quadrant.

Reproductive: Uterus and adnexa unremarkable.  No mass.

Other: Small amount of free fluid in the pelvis.  No free air.

Musculoskeletal: No acute bony abnormality.
IMPRESSION: Prominent right lower quadrant mesenteric lymph nodes may reflect
mesenteric adenitis.

Normal appendix.

Fatty infiltration of the liver.

## 2023-07-04 ENCOUNTER — Encounter: Payer: Self-pay | Admitting: Obstetrics and Gynecology

## 2023-07-04 ENCOUNTER — Ambulatory Visit: Payer: Medicaid Other | Admitting: Obstetrics and Gynecology

## 2023-07-04 VITALS — BP 108/80 | HR 78 | Wt 172.0 lb

## 2023-07-04 DIAGNOSIS — R87820 Cervical low risk human papillomavirus (HPV) DNA test positive: Secondary | ICD-10-CM

## 2023-07-04 DIAGNOSIS — B977 Papillomavirus as the cause of diseases classified elsewhere: Secondary | ICD-10-CM | POA: Diagnosis not present

## 2023-07-04 NOTE — Progress Notes (Signed)
 Acute Office Visit  Subjective:    Patient ID: Natasha Lowery, female    DOB: September 26, 1990, 33 y.o.   MRN: [id number]   HPI 33 y.o. presents today for NGYN (NGYN, abnormal pap smear 05-10-23 neg HPV HR+ 16,18/45 neg. Consult for colposcopy) .from [facility] 2022 NIL/HPV pos pap and in 2023 and recent pap with NIL/HPV pos HPV 16/18/45 negative. She believes she had the HPV vaccination when she was a child.   Patient's last menstrual period was 06/13/2023. Period Duration (Days): 6 Period Pattern: Regular Menstrual Flow: Heavy (with spotting in between cycle) Menstrual Control: Other (Comment) (menstrual cup)  Review of Systems     Past Medical History:  Diagnosis Date   Abnormal Pap smear of cervix    hpv   Anxiety    Depression    Heavy periods    Irregular periods     Past Surgical History:  Procedure Laterality Date   none     Social History   Socioeconomic History   Marital status: Married    Spouse name: Not on file   Number of children: Not on file   Years of education: Not on file   Highest education level: Not on file  Occupational History   Not on file  Tobacco Use   Smoking status: Never   Smokeless tobacco: Never  Vaping Use   Vaping status: Never Used  Substance and Sexual Activity   Alcohol use: Not Currently   Drug use: No   Sexual activity: Yes    Partners: Male    Birth control/protection: Implant    Comment: nexplanon inserted 11/24  Other Topics Concern   Not on file  Social History Narrative   Not on file   Social Drivers of Health   Financial Resource Strain: Not on file  Food Insecurity: Not on file  Transportation Needs: Not on file  Physical Activity: Not on file  Stress: Not on file  Social Connections: Unknown (08/27/2021)   Received from [facility], [facility]   Social Network    Social Network: Not on file  Intimate Partner Violence: Unknown (07/21/2021)   Received from [employer], [facility]    HITS    Physically Hurt: Not on file    Insult or Talk Down To: Not on file    Threaten Physical Harm: Not on file    Scream or Curse: Not on file    Objective:    OBGyn Exam  BP 108/80   Pulse 78   Wt 172 lb (78 kg)   LMP 06/13/2023   SpO2 99%   BMI 27.76 kg/m  Wt Readings from Last 3 Encounters:  07/04/23 172 lb (78 kg)  10/24/21 201 lb (91.2 kg)  10/21/21 203 lb 12.8 oz (92.4 kg)        Patient informed chaperone available to be present for breast and/or pelvic exam. Patient has requested no chaperone to be present. Patient has been advised what will be completed during breast and pelvic exam.   Assessment & Plan:  P:       Patient was counseled on her Pap smear results, on the HPV virus, and on the Gardasil vaccination.  Counseled on the next step of treatment which is the colposcopy.  Discussed in detail the procedure and potential for a small biopsy.  Discussed that she will need to be on pelvic rest after the procedure for at least a week.  Counseled that her Pap smear is not showing cancer  but abnormal changes that could actually develop into cancer, over a length of time, with no further monitoring.  Counseled on smoking and increased risk of 6 fold for cervical cancer.  Counseled on safe sexual practices. Patient voiced understanding.  She will return for the colposcopy.   20 minutes spent on reviewing records, imaging,  and one on one patient time and counseling patient and documentation Dr. Judith Blonder

## 2023-08-09 ENCOUNTER — Ambulatory Visit: Admitting: Obstetrics and Gynecology

## 2023-08-09 ENCOUNTER — Other Ambulatory Visit (HOSPITAL_COMMUNITY)
Admission: RE | Admit: 2023-08-09 | Discharge: 2023-08-09 | Disposition: A | Discharge status: Home / Self Care | Source: Ambulatory Visit | Attending: Obstetrics and Gynecology | Admitting: Obstetrics and Gynecology

## 2023-08-09 ENCOUNTER — Encounter: Payer: Self-pay | Admitting: Obstetrics and Gynecology

## 2023-08-09 VITALS — BP 118/80 | HR 74

## 2023-08-09 DIAGNOSIS — B977 Papillomavirus as the cause of diseases classified elsewhere: Secondary | ICD-10-CM

## 2023-08-09 DIAGNOSIS — N72 Inflammatory disease of cervix uteri: Secondary | ICD-10-CM

## 2023-08-09 DIAGNOSIS — A630 Anogenital (venereal) warts: Secondary | ICD-10-CM | POA: Insufficient documentation

## 2023-08-09 NOTE — Progress Notes (Signed)
   Acute Office Visit  Subjective:    Patient ID: Natasha Lowery, female    DOB: Mar 23, 1991, 33 y.o.   MRN: [id number]   HPI 33 y.o. presents today for colposcopy (Colposcopy//jj/PAP: 05-10-23 neg HPV HR +, 16,18,45 neg) . Pap smear at [facility] and HPV + with normal pap smear For colposcopy  No LMP recorded. Patient has had an implant.    Review of Systems     Objective:    OBGyn Exam  BP 118/80   Pulse 74  Wt Readings from Last 3 Encounters:  07/04/23 172 lb (78 kg)  10/24/21 201 lb (91.2 kg)  10/21/21 203 lb 12.8 oz (92.4 kg)        Joy, CMA present for entire procedure  Colposcopy Procedure Note Natasha Lowery 08/09/2023  Indications:  Procedure Details  The risks and benefits of the procedure and Written informed consent obtained.  Speculum placed in vagina and excellent visualization of cervix achieved, cervix swabbed x 3 with acetic acid solution.  Impression:normal  Satisfactory( ECC zone seen): yes  Findings:  Cervix colposcopy biopsy taken: 6 O'clock for aceto white changes   ECC performed with cytobrush  Hemostasis obtained with application of Monsel's Solution    Complications:    Patient tolerated the procedure well  Assessment:   Normal pap smear HPV+ for colpo exam  Plan:  To notify patient in epic and phone call of results and plan of care RTC for repeat pap smear in 6 months   Kandi Oris

## 2023-08-13 ENCOUNTER — Encounter: Payer: Self-pay | Admitting: Obstetrics and Gynecology

## 2023-08-13 LAB — SURGICAL PATHOLOGY

## 2023-11-01 ENCOUNTER — Ambulatory Visit: Admitting: Nurse Practitioner

## 2023-11-01 ENCOUNTER — Encounter: Payer: Self-pay | Admitting: Nurse Practitioner

## 2023-11-01 VITALS — BP 128/60 | HR 97 | Ht 65.5 in | Wt 182.6 lb

## 2023-11-01 DIAGNOSIS — L732 Hidradenitis suppurativa: Secondary | ICD-10-CM

## 2023-11-01 MED ORDER — CLINDAMYCIN PHOSPHATE 1 % EX SOLN: Freq: Two times a day (BID) | CUTANEOUS | 0 refills | Status: AC

## 2023-11-01 NOTE — Progress Notes (Signed)
   Acute Office Visit  Subjective:    Patient ID: Natasha Lowery, female    DOB: July 20, 1990, 33 y.o.   MRN: [id number]   HPI 33 y.o. presents today for recurring boil-like areas since November 2024. Sores are located on inner thighs/groin and right axillary. Sores come and go, can be painful, sometimes will have drainage. Partner present during visit.   Patient's last menstrual period was 10/29/2023 (approximate). Period Duration (Days): 6-7 Period Pattern: (!) Irregular Menstrual Flow: Heavy Menstrual Control: Other (Comment) (cup) Dysmenorrhea: (!) Severe Dysmenorrhea Symptoms: Cramping, Nausea, Headache  Review of Systems  Constitutional: Negative.   Skin:  Positive for wound.       Objective:    Physical Exam Constitutional:      Appearance: Normal appearance.  Skin:        BP 128/60 (BP Location: Right Arm)   Pulse 97   Ht 5' 5.5 (1.664 m)   Wt 182 lb 9.6 oz (82.8 kg)   LMP 10/29/2023 (Approximate)   SpO2 99%   BMI 29.92 kg/m  Wt Readings from Last 3 Encounters:  11/01/23 182 lb 9.6 oz (82.8 kg)  07/04/23 172 lb (78 kg)  10/24/21 201 lb (91.2 kg)          Assessment & Plan:   Problem List Items Addressed This Visit   None Visit Diagnoses       Hidradenitis suppurativa of multiple sites    -  Primary   Relevant Medications   clindamycin  (CLEOCIN  T) 1 % external solution   Other Relevant Orders   Ambulatory referral to Dermatology      Plan: [facility]. Will refer to dermatology for management. Apply clindamycin  solution BID, warm compresses as needed.       Annabella DELENA Shutter DNP, 2:23 PM 11/01/2023

## 2023-11-12 ENCOUNTER — Ambulatory Visit: Admitting: Obstetrics and Gynecology

## 2024-02-13 ENCOUNTER — Ambulatory Visit: Admitting: Obstetrics and Gynecology

## 2024-03-27 ENCOUNTER — Ambulatory Visit: Admitting: Obstetrics and Gynecology

## 2024-03-27 ENCOUNTER — Other Ambulatory Visit (HOSPITAL_COMMUNITY)
Admission: RE | Admit: 2024-03-27 | Discharge: 2024-03-27 | Disposition: A | Discharge status: Home / Self Care | Source: Ambulatory Visit | Attending: Obstetrics and Gynecology | Admitting: Obstetrics and Gynecology

## 2024-03-27 VITALS — BP 104/60 | HR 88 | Wt 190.0 lb

## 2024-03-27 DIAGNOSIS — Z124 Encounter for screening for malignant neoplasm of cervix: Secondary | ICD-10-CM | POA: Diagnosis not present

## 2024-03-27 DIAGNOSIS — Z8742 Personal history of other diseases of the female genital tract: Secondary | ICD-10-CM | POA: Diagnosis not present

## 2024-03-27 NOTE — Progress Notes (Unsigned)
° °  Acute Office Visit  Subjective:    Patient ID: Natasha Lowery, female    DOB: 05/01/1990, 33 y.o.   MRN: [id number]   HPI 33 y.o. presents today for Gynecologic Exam . Here today for repeat 6 month pap smear  Patient's last menstrual period was 03/18/2024 (exact date). Period Duration (Days): 7 Period Pattern: Regular Menstrual Flow: Light Menstrual Control: Other (Comment) (menstrual cup) Dysmenorrhea: (!) Severe Dysmenorrhea Symptoms: Nausea, Diarrhea, Headache  Review of Systems     Objective:    OBGyn Exam  LMP 03/18/2024 (Exact Date)  Wt Readings from Last 3 Encounters:  11/01/23 182 lb 9.6 oz (82.8 kg)  07/04/23 172 lb (78 kg)  10/24/21 201 lb (91.2 kg)      No results found for: DIAGPAP, HPVHIGH, ADEQPAP Pap smear at [facility] and HPV + with normal pap smear  Colposcopy normal results  Natasha Lowery, CMA was present for the exam SVE: normal cervix. No lesions, normal discharge  Assessment & Plan:  H/o HPV+ with normal pap smear and normal colposcopy for 6 month repeat Pap smear collected.  To notify patient of the results  Natasha Lowery

## 2024-04-03 LAB — CYTOLOGY - PAP: Diagnosis: NEGATIVE

## 2024-04-04 ENCOUNTER — Ambulatory Visit: Payer: Self-pay | Admitting: Obstetrics and Gynecology

## 2024-04-04 NOTE — Telephone Encounter (Signed)
 Spoke with patient, questions answered.  Patient request to proceed with HPV vaccine series. Nexplanon  for contraceptive.  Nurse visit scheduled for 04/07/24 at 0845.   Routing to provider for final review. Patient is agreeable to disposition. Will close encounter.

## 2024-04-07 ENCOUNTER — Ambulatory Visit

## 2024-06-16 ENCOUNTER — Ambulatory Visit: Admitting: Physician Assistant
